# Patient Record
Sex: Female | Born: 1945 | Race: Black or African American | Hispanic: No | Smoking: Former smoker
Health system: Southern US, Community
[De-identification: ages and names within clinical notes are randomized; demographics above are authoritative.]

## PROBLEM LIST (undated history)

## (undated) DIAGNOSIS — R011 Cardiac murmur, unspecified: Secondary | ICD-10-CM

## (undated) DIAGNOSIS — E119 Type 2 diabetes mellitus without complications: Secondary | ICD-10-CM

## (undated) DIAGNOSIS — E7800 Pure hypercholesterolemia, unspecified: Secondary | ICD-10-CM

## (undated) DIAGNOSIS — J45909 Unspecified asthma, uncomplicated: Secondary | ICD-10-CM

## (undated) DIAGNOSIS — I10 Essential (primary) hypertension: Secondary | ICD-10-CM

## (undated) HISTORY — DX: Type 2 diabetes mellitus without complications: E11.9

## (undated) HISTORY — DX: Unspecified asthma, uncomplicated: J45.909

## (undated) HISTORY — DX: Cardiac murmur, unspecified: R01.1

## (undated) HISTORY — PX: TUBAL LIGATION: SHX77

## (undated) HISTORY — PX: TONSILLECTOMY: SUR1361

---

## 1998-05-01 ENCOUNTER — Other Ambulatory Visit: Admission: RE | Admit: 1998-05-01 | Discharge: 1998-05-01 | Payer: Self-pay | Admitting: Internal Medicine

## 1999-10-19 ENCOUNTER — Other Ambulatory Visit: Admission: RE | Admit: 1999-10-19 | Discharge: 1999-10-19 | Payer: Self-pay | Admitting: Internal Medicine

## 2000-10-17 ENCOUNTER — Other Ambulatory Visit: Admission: RE | Admit: 2000-10-17 | Discharge: 2000-10-17 | Payer: Self-pay | Admitting: Internal Medicine

## 2001-10-22 ENCOUNTER — Other Ambulatory Visit: Admission: RE | Admit: 2001-10-22 | Discharge: 2001-10-22 | Payer: Self-pay | Admitting: Internal Medicine

## 2004-03-17 ENCOUNTER — Ambulatory Visit (HOSPITAL_COMMUNITY): Admission: RE | Admit: 2004-03-17 | Discharge: 2004-03-17 | Payer: Self-pay | Admitting: Gastroenterology

## 2004-03-17 ENCOUNTER — Encounter (INDEPENDENT_AMBULATORY_CARE_PROVIDER_SITE_OTHER): Payer: Self-pay | Admitting: *Deleted

## 2005-11-04 ENCOUNTER — Encounter: Admission: RE | Admit: 2005-11-04 | Discharge: 2005-11-04 | Payer: Self-pay | Admitting: Gastroenterology

## 2007-06-15 ENCOUNTER — Emergency Department (HOSPITAL_COMMUNITY): Admission: EM | Admit: 2007-06-15 | Discharge: 2007-06-15 | Payer: Self-pay | Admitting: Family Medicine

## 2007-11-15 ENCOUNTER — Emergency Department (HOSPITAL_COMMUNITY): Admission: EM | Admit: 2007-11-15 | Discharge: 2007-11-15 | Payer: Self-pay | Admitting: Emergency Medicine

## 2011-03-25 NOTE — Op Note (Signed)
Anita Carpenter, Anita Carpenter                            ACCOUNT NO.:  [id number]   MEDICAL RECORD NO.:  [id number]                   PATIENT TYPE:  AMB   LOCATION:  ENDO                                 FACILITY:  [facility]   PHYSICIAN:  Anselmo Rod, M.D.               DATE OF BIRTH:  Mar 16, 1946   DATE OF PROCEDURE:  03/17/2004  DATE OF DISCHARGE:                                 OPERATIVE REPORT   PROCEDURE PERFORMED:  Colonoscopy with biopsies.   ENDOSCOPIST:  Charna Elizabeth, M.D.   INSTRUMENT USED:  Olympus video colonoscope.   INDICATIONS FOR PROCEDURE:  The patient is a 65 year old African-American  female undergoing a screening colonoscopy.  The patient has a history of  hematochezia and family history of colonic polyps in her mother.  No known  family history of colon cancer.   PREPROCEDURE PREPARATION:  Informed consent was procured from the patient.  The patient was fasted for eight hours prior to the procedure and prepped  with a bottle of magnesium citrate and a gallon of GoLYTELY the night prior  to the procedure.   PREPROCEDURE PHYSICAL:  The patient had stable vital signs.  Neck supple.  Chest clear to auscultation.  S1 and S2 regular.  Abdomen soft with normal  bowel sounds.   DESCRIPTION OF PROCEDURE:  The patient was placed in left lateral decubitus  position and sedated with 100 mg of Demerol and 10 mg of Versed  intravenously.  Once the patient was adequately sedated and maintained on  low flow oxygen and continuous cardiac monitoring, the Olympus video  colonoscope was advanced from the rectum to the cecum.  The appendicular  orifice and ileocecal valve were clearly visualized and photographed.  A  small sessile polyp was biopsied from the midtransverse colon.  There was  patchy erythema in the rectosigmoid of unclear significance.  This was  biopsied as well.  Retroflexion in the rectum revealed prominent internal  hemorrhoids.  No other abnormalities were appreciated.   The patient  tolerated the procedure well without immediate complications.   IMPRESSION:  1. Prominent nonbleeding internal hemorrhoids seen on retroflexion.  2. Patchy erythema in the rectosigmoid area biopsied for pathology.  3. Small sessile polyp biopsied from midtransverse colon.  4. Normal-appearing right colon and cecum.   RECOMMENDATIONS:  1. Await pathology results.  2. Avoid all nonsteroidals including aspirin for the next two weeks.  3. Continue high fiber diet with liberal fluid intake.  4. Anusol HC 2.5% suppositories one p.r. at bedtime.  5. Outpatient followup in the next two weeks for further recommendations.                                               Anselmo Rod, M.D.    JNM/MEDQ  D:  03/17/2004  T:  03/18/2004  Job:  [id number]   cc:   Merlene Laughter. Renae Gloss, M.D.  [address]  [address]  [place]  [place] [address]  Fax: [phone]

## 2014-01-23 ENCOUNTER — Observation Stay (HOSPITAL_COMMUNITY)
Admission: EM | Admit: 2014-01-23 | Discharge: 2014-01-24 | Disposition: A | Discharge status: Home / Self Care | Payer: Medicare Other | Attending: Internal Medicine | Admitting: Internal Medicine

## 2014-01-23 ENCOUNTER — Encounter (HOSPITAL_COMMUNITY): Payer: Self-pay | Admitting: Emergency Medicine

## 2014-01-23 ENCOUNTER — Emergency Department (HOSPITAL_COMMUNITY): Payer: Medicare Other

## 2014-01-23 DIAGNOSIS — I10 Essential (primary) hypertension: Secondary | ICD-10-CM | POA: Insufficient documentation

## 2014-01-23 DIAGNOSIS — K922 Gastrointestinal hemorrhage, unspecified: Secondary | ICD-10-CM | POA: Insufficient documentation

## 2014-01-23 DIAGNOSIS — R079 Chest pain, unspecified: Secondary | ICD-10-CM | POA: Diagnosis present

## 2014-01-23 DIAGNOSIS — Z79899 Other long term (current) drug therapy: Secondary | ICD-10-CM | POA: Insufficient documentation

## 2014-01-23 DIAGNOSIS — F17200 Nicotine dependence, unspecified, uncomplicated: Secondary | ICD-10-CM | POA: Insufficient documentation

## 2014-01-23 DIAGNOSIS — E7800 Pure hypercholesterolemia, unspecified: Secondary | ICD-10-CM | POA: Insufficient documentation

## 2014-01-23 DIAGNOSIS — R0789 Other chest pain: Principal | ICD-10-CM | POA: Insufficient documentation

## 2014-01-23 HISTORY — DX: Chest pain, unspecified: R07.9

## 2014-01-23 HISTORY — DX: Pure hypercholesterolemia, unspecified: E78.00

## 2014-01-23 HISTORY — DX: Essential (primary) hypertension: I10

## 2014-01-23 LAB — CBC WITH DIFFERENTIAL/PLATELET
BASOS ABS: 0 10*3/uL (ref 0.0–0.1)
Basophils Relative: 0 % (ref 0–1)
Eosinophils Absolute: 0.1 10*3/uL (ref 0.0–0.7)
Eosinophils Relative: 1 % (ref 0–5)
HCT: 37.1 % (ref 36.0–46.0)
Hemoglobin: 11.8 g/dL — ABNORMAL LOW (ref 12.0–15.0)
LYMPHS PCT: 40 % (ref 12–46)
Lymphs Abs: 2.2 10*3/uL (ref 0.7–4.0)
MCH: 23.5 pg — ABNORMAL LOW (ref 26.0–34.0)
MCHC: 31.8 g/dL (ref 30.0–36.0)
MCV: 73.9 fL — ABNORMAL LOW (ref 78.0–100.0)
Monocytes Absolute: 0.4 10*3/uL (ref 0.1–1.0)
Monocytes Relative: 8 % (ref 3–12)
NEUTROS ABS: 2.7 10*3/uL (ref 1.7–7.7)
Neutrophils Relative %: 50 % (ref 43–77)
PLATELETS: 326 10*3/uL (ref 150–400)
RBC: 5.02 MIL/uL (ref 3.87–5.11)
RDW: 16 % — AB (ref 11.5–15.5)
WBC: 5.4 10*3/uL (ref 4.0–10.5)

## 2014-01-23 LAB — BASIC METABOLIC PANEL
BUN: 13 mg/dL (ref 6–23)
CALCIUM: 9.6 mg/dL (ref 8.4–10.5)
CHLORIDE: 100 meq/L (ref 96–112)
CO2: 25 mEq/L (ref 19–32)
CREATININE: 0.69 mg/dL (ref 0.50–1.10)
GFR calc non Af Amer: 89 mL/min — ABNORMAL LOW (ref >90)
Glucose, Bld: 101 mg/dL — ABNORMAL HIGH (ref 70–99)
Potassium: 4.2 mEq/L (ref 3.7–5.3)
SODIUM: 139 meq/L (ref 137–147)

## 2014-01-23 LAB — POC OCCULT BLOOD, ED: FECAL OCCULT BLD: POSITIVE — AB

## 2014-01-23 LAB — I-STAT TROPONIN, ED: TROPONIN I, POC: 0 ng/mL (ref 0.00–0.08)

## 2014-01-23 LAB — TROPONIN I

## 2014-01-23 MED ORDER — METOPROLOL TARTRATE 25 MG PO TABS
25.0000 mg | ORAL_TABLET | Freq: Every day | ORAL | Status: DC
  Administered 2014-01-23 – 2014-01-24 (×2): 25 mg via ORAL
  Filled 2014-01-23 (×2): qty 1

## 2014-01-23 MED ORDER — MINOCYCLINE HCL 100 MG PO CAPS
100.0000 mg | ORAL_CAPSULE | Freq: Every day | ORAL | Status: DC
  Administered 2014-01-24: 100 mg via ORAL
  Filled 2014-01-23 (×2): qty 1

## 2014-01-23 MED ORDER — ALBUTEROL SULFATE HFA 108 (90 BASE) MCG/ACT IN AERS: 2.0000 | INHALATION_SPRAY | Freq: Four times a day (QID) | RESPIRATORY_TRACT | Status: DC | PRN

## 2014-01-23 MED ORDER — ASPIRIN 325 MG PO TABS
325.0000 mg | ORAL_TABLET | Freq: Once | ORAL | Status: AC
  Administered 2014-01-23: 325 mg via ORAL
  Filled 2014-01-23: qty 1

## 2014-01-23 MED ORDER — ATORVASTATIN CALCIUM 10 MG PO TABS
10.0000 mg | ORAL_TABLET | Freq: Every day | ORAL | Status: DC
  Filled 2014-01-23 (×2): qty 1

## 2014-01-23 MED ORDER — ENOXAPARIN SODIUM 40 MG/0.4ML ~~LOC~~ SOLN
40.0000 mg | Freq: Every day | SUBCUTANEOUS | Status: DC
  Filled 2014-01-23 (×2): qty 0.4

## 2014-01-23 MED ORDER — HYDRALAZINE HCL 25 MG PO TABS
25.0000 mg | ORAL_TABLET | Freq: Every day | ORAL | Status: DC
  Administered 2014-01-23 – 2014-01-24 (×2): 25 mg via ORAL
  Filled 2014-01-23 (×2): qty 1

## 2014-01-23 MED ORDER — IRBESARTAN 75 MG PO TABS
75.0000 mg | ORAL_TABLET | Freq: Every day | ORAL | Status: DC
Start: 2014-01-23 — End: 2014-01-24
  Filled 2014-01-23 (×2): qty 1

## 2014-01-23 MED ORDER — ALBUTEROL SULFATE (2.5 MG/3ML) 0.083% IN NEBU: 2.5000 mg | INHALATION_SOLUTION | Freq: Four times a day (QID) | RESPIRATORY_TRACT | Status: DC | PRN

## 2014-01-23 NOTE — ED Notes (Signed)
X-ray notified pt available for transport.  

## 2014-01-23 NOTE — ED Provider Notes (Signed)
CSN: [id number]     Arrival date & time 01/23/14  1247 History   First MD Initiated Contact with Patient 01/23/14 1300     Chief Complaint  Patient presents with  . Chest Pain  . GI Bleeding     (Consider location/radiation/quality/duration/timing/severity/associated sxs/prior Treatment) HPI 68 yo female presents with "chest tightness" and rectal bleeding. Patient admits to hx of hemorrhoids and fissures in the past. Patient states she was having a BM when she noticed blood while wiping herself. Patient states some blood dripped on the floor. As patient bent over to clean it up and upon standing she became lightheaded. Patient sat down on couch when she developed chest tightness with associate SOB and sweaty arms/hands. Patient admits to some jitteriness. Patient states she developed some neck stiffness on the way to the hospital. Patient also mentions she has been under a lot of stress lately from having a close friend recently dx with cancer. Patient states all of her symptom have resolved and is not currently having any chest pain or SOB.   PCP - Cala Bradford shelton  Advanced age > 82 yo: Yes HTN: Yes Hyperlipidemia: Yes Cigarette smoking: Yes (1/4 pack per day x 40 years)  Diabetes Mellitus: No Family hx of CAD or MI < 75 yo : No Female or Post menopausal: Yes Cocaine use: No Prior MI: No CABG: No Stress test: Yes, 2-3 years and reported to be normal  Angina: No  HR > 100 bpm: No O2 sat on RA < 95%: No Prior hx of venous thromboembolism:No Trauma or surgery in past 4 wks:No Hemoptysis:No Exogenous Estrogen use:No Unilateral Leg swelling: No Pre tests probability for PE < 15%:No  Past Medical History  Diagnosis Date  . Hypertension   . High cholesterol    Past Surgical History  Procedure Laterality Date  . Tonsillectomy     No family history on file. History  Substance Use Topics  . Smoking status: Current Some Day Smoker  . Smokeless tobacco: Not on file  . Alcohol  Use: Yes   OB History   Grav Para Term Preterm Abortions TAB SAB Ect Mult Living                 Review of Systems  All other systems reviewed and are negative.      Allergies  Review of patient's allergies indicates no known allergies.  Home Medications   Current Outpatient Rx  Name  Route  Sig  Dispense  Refill  . albuterol (PROVENTIL HFA;VENTOLIN HFA) 108 (90 BASE) MCG/ACT inhaler   Inhalation   Inhale 2 puffs into the lungs every 6 (six) hours as needed for wheezing or shortness of breath.         . Cholecalciferol (VITAMIN D3) 5000 UNITS CAPS   Oral   Take 5,000 Units by mouth daily.         . hydrALAZINE (APRESOLINE) 25 MG tablet   Oral   Take 25 mg by mouth daily.         . metoprolol tartrate (LOPRESSOR) 25 MG tablet   Oral   Take 25 mg by mouth daily.         . minocycline (MINOCIN,DYNACIN) 100 MG capsule   Oral   Take 100 mg by mouth daily.         . rosuvastatin (CRESTOR) 10 MG tablet   Oral   Take 10 mg by mouth daily.         . valsartan (DIOVAN)  320 MG tablet   Oral   Take 320 mg by mouth daily.          BP 130/76  Pulse 68  Temp(Src) 98.7 F (37.1 C) (Oral)  Resp 18  SpO2 99% Physical Exam  Nursing note and vitals reviewed. Constitutional: She is oriented to person, place, and time. She appears well-developed and well-nourished. No distress.  HENT:  Head: Normocephalic and atraumatic.  Right Ear: Tympanic membrane and ear canal normal.  Left Ear: Tympanic membrane and ear canal normal.  Nose: Nose normal. Right sinus exhibits no maxillary sinus tenderness and no frontal sinus tenderness. Left sinus exhibits no maxillary sinus tenderness and no frontal sinus tenderness.  Mouth/Throat: Uvula is midline, oropharynx is clear and moist and mucous membranes are normal. No oropharyngeal exudate, posterior oropharyngeal edema or posterior oropharyngeal erythema.  Eyes: Conjunctivae and EOM are normal. Pupils are equal, round, and  reactive to light. Right eye exhibits no discharge. Left eye exhibits no discharge. No scleral icterus.  Neck: Trachea normal, normal range of motion and phonation normal. Neck supple. No JVD present. Carotid bruit is not present. No rigidity. No tracheal deviation, no edema and no erythema present.  Cardiovascular: Normal rate, regular rhythm and normal heart sounds.  Exam reveals no gallop and no friction rub.   No murmur heard. Pulmonary/Chest: Effort normal and breath sounds normal. No stridor. No respiratory distress. She has no wheezes. She has no rhonchi. She has no rales.  Abdominal: Soft. Bowel sounds are normal. She exhibits no distension. There is no tenderness.  Genitourinary: Rectal exam shows external hemorrhoid. Rectal exam shows no internal hemorrhoid, no fissure, no mass, no tenderness and anal tone normal. Guaiac positive stool.  Multiple external hemorrhoids present on exam with noted Bright red blood around anus. Hemoccult positive but suspect falsely positive due to bleeding from hemorrhoids.   Musculoskeletal: Normal range of motion. She exhibits no edema.  Lymphadenopathy:    She has no cervical adenopathy.  Neurological: She is alert and oriented to person, place, and time. She has normal strength. No cranial nerve deficit or sensory deficit.  Skin: Skin is warm and dry. She is not diaphoretic.  Psychiatric: She has a normal mood and affect. Her behavior is normal.    ED Course  Procedures (including critical care time) Labs Review Labs Reviewed  BASIC METABOLIC PANEL - Abnormal; Notable for the following:    Glucose, Bld 101 (*)    GFR calc non Af Amer 89 (*)    All other components within normal limits  CBC WITH DIFFERENTIAL - Abnormal; Notable for the following:    Hemoglobin 11.8 (*)    MCV 73.9 (*)    MCH 23.5 (*)    RDW 16.0 (*)    All other components within normal limits  POC OCCULT BLOOD, ED - Abnormal; Notable for the following:    Fecal Occult Bld  POSITIVE (*)    All other components within normal limits  OCCULT BLOOD X 1 CARD TO LAB, STOOL  TROPONIN I  I-STAT TROPOININ, ED   Imaging Review Dg Chest 2 View  01/23/2014   CLINICAL DATA:  Chest pain with history of hypertension and tobacco use  EXAM: CHEST  2 VIEW  COMPARISON:  None.  FINDINGS: The lungs are borderline hyperinflated with hemidiaphragm flattening. There is no evidence of pneumonia nor abnormal parenchymal nodules. The cardiopericardial silhouette is normal in size. The pulmonary vascularity is not engorged. The mediastinum is normal in width. There is  no pleural effusion. There is mild tortuosity of the descending thoracic aorta. The observed portions of the bony thorax exhibit no acute abnormality. Degenerative disc space narrowing of the upper and mid thoracic spine is present.  IMPRESSION: There is very mild hyperinflation which may be voluntary but also may reflect underlying COPD or reactive airway disease. There is no evidence of pneumonia nor CHF or other acute cardiopulmonary disease.   Electronically Signed   By: David  [place]   On: 01/23/2014 14:27     EKG Interpretation   Date/Time:  Thursday January 23 2014 15:22:28 EDT Ventricular Rate:  68 PR Interval:  140 QRS Duration: 88 QT Interval:  389 QTC Calculation: 414 R Axis:   -18 Text Interpretation:  Sinus rhythm Probable left atrial enlargement  Borderline left axis deviation Low voltage, precordial leads ST changes  inferiorly Confirmed by Rubin Payor  MD, Harrold Donath [phone]) on 01/23/2014 4:09:04  PM      MDM   Final diagnoses:  Chest pain  GI bleed   Mild microcytic anemia with hgb at 11.8 BMP is WNL Hemoccult positive, suspect false positive secondary to active hemorrhoids with notable bleeding. Hx not consistent with Diverticulitis.  Patient does not meet PERC criteria due to her age but has minimal risk for PE using Well's PE criteria that does not factor in age.  Initial Troponin negative, will get  delta troponin.CXR shows mild hyperinflation, Possible COPD vs voluntary. No evidence of PNA or CHF. No widened mediastinum, doubt dissection.  EKG shows mild ST depression in inferior leads Heart score - 5.  Patient discussed with Dr. Benjiman Core. Will consult hospitalist. Plan to admit patient for ACS ruleout.   Meds given in ED:  Medications  aspirin tablet 325 mg (not administered)    New Prescriptions   No medications on file       Rudene Anda, [place] 01/23/14 2150

## 2014-01-23 NOTE — ED Notes (Addendum)
Got up at 1030 and went to have and she washed herself( bottom) and she had blood on the floor and on rag  then she got  dizzy after cleaning the floor and had cp  States does have hx of Hemorids she states and she did not see any blood in bowl after having bm this am

## 2014-01-23 NOTE — ED Notes (Signed)
Patient transported to X-ray 

## 2014-01-23 NOTE — H&P (Signed)
[facility] History and Physical  GELENA KLOSINSKI WUJ:[id number] DOB: 04-25-1946 DOA: 01/23/2014  Referring physician: ED physician PCP: No primary provider on file.   Chief Complaint: Chest tightness  HPI:  Patient is 68 year old very pleasant female history of hypertension, hyperlipidemia, GI bleed secondary to hemorrhoids fissures in the past, presenting to [facility] emergency department with main concern of sudden onset of chest tightness, pressure like, 5/10 in severity, lasting 30 minutes, nonradiating, resolving spontaneously, no specific aggravating or alleviating symptoms, no specific triggering symptoms, associated with shortness of breath, diaphoresis, lightheadedness. Patient denies similar events in the past. She reports having bowel movement earlier today and noticed bright red blood on the toilet paper after bowel movements. Some blood dropped on the floor and she was trying to clean it out at which home and she became more lightheaded and felt as if she was going to pass out. She denies fevers or chills, no specific abdominal or urinary concerns, no focal neurological symptoms such as headaches, visual changes, numbness or pain.  In emergency department, patient found to be hemodynamically stable, blood work unremarkable, [facility] asked to admit for chest pain workup. Telemetry bed requested  Assessment and Plan: Active Problems: Chest pain - Unclear etiology - Admit to telemetry, obtain 12-lead EKG, cycle cardiac enzymes - Provide oxygen and analgesia as needed for symptom control Blood in stool -  Possibly related to hemorrhoids - FOBT pending - Hemoglobin stable on admission, no indication for transfusion, repeat CBC in the morning Lightheadedness - Possibly associated with acute blood loss - Will check orthostatic vitals, TSH, cycle cardiac enzymes, obtain 12-lead EKG - Blood pressure stable on admission Hypertension - Continue home medical  regimen Hyperlipidemia - Continue statin  Radiological Exams on Admission: Dg Chest 2 View   01/23/2014   There is very mild hyperinflation which may be voluntary but also may reflect underlying COPD or reactive airway disease. There is no evidence of pneumonia nor CHF or other acute cardiopulmonary disease.    Code Status: Full Family Communication: Pt at bedside Disposition Plan: Admit for further evaluation     Review of Systems:  Constitutional: Negative for fever, chills. Negative for diaphoresis.  HENT: Negative for hearing loss, ear pain, nosebleeds, congestion, sore throat, neck pain, tinnitus and ear discharge.   Eyes: Negative for blurred vision, double vision, photophobia, pain, discharge and redness.  Respiratory: Negative for cough, hemoptysis, sputum production, shortness of breath, wheezing and stridor.   Cardiovascular: Per history of present illness Gastrointestinal: Negative for nausea, vomiting and abdominal pain. Negative for heartburn, constipation Genitourinary: Negative for dysuria, urgency, frequency, hematuria and flank pain.  Musculoskeletal: Negative for myalgias, back pain, joint pain and falls.  Skin: Negative for itching and rash.  Neurological: Negative for tingling, tremors, sensory change, speech change, focal weakness, loss of consciousness and headaches.  Endo/Heme/Allergies: Negative for environmental allergies and polydipsia. Does not bruise/bleed easily.  Psychiatric/Behavioral: Negative for suicidal ideas. The patient is not nervous/anxious.      Past Medical History  Diagnosis Date  . Hypertension   . High cholesterol     Past Surgical History  Procedure Laterality Date  . Tonsillectomy      Social History:  reports that she has been smoking.  She does not have any smokeless tobacco history on file. She reports that she drinks alcohol. Her drug history is not on file.  No Known Allergies   No known family medical history Prior to  Admission medications   Medication Sig Start  Date End Date Taking? Authorizing Provider  albuterol (PROVENTIL HFA;VENTOLIN HFA) 108 (90 BASE) MCG/ACT inhaler Inhale 2 puffs into the lungs every 6 (six) hours as needed for wheezing or shortness of breath.   Yes Historical Provider, MD  Cholecalciferol (VITAMIN D3) 5000 UNITS CAPS Take 5,000 Units by mouth daily.   Yes Historical Provider, MD  hydrALAZINE (APRESOLINE) 25 MG tablet Take 25 mg by mouth daily.   Yes Historical Provider, MD  metoprolol tartrate (LOPRESSOR) 25 MG tablet Take 25 mg by mouth daily.   Yes Historical Provider, MD  minocycline (MINOCIN,DYNACIN) 100 MG capsule Take 100 mg by mouth daily.   Yes Historical Provider, MD  rosuvastatin (CRESTOR) 10 MG tablet Take 10 mg by mouth daily.   Yes Historical Provider, MD  valsartan (DIOVAN) 320 MG tablet Take 320 mg by mouth daily.   Yes Historical Provider, MD    Physical Exam: Filed Vitals:   01/23/14 1515 01/23/14 1530 01/23/14 1545 01/23/14 1630  BP: 140/84 134/87 130/76 127/76  Pulse: 81 72 68 70  Temp:      TempSrc:      Resp: 11 15 18 25   SpO2: 100% 100% 99% 100%    Physical Exam  Constitutional: Appears well-developed and well-nourished. No distress.  HENT: Normocephalic. External right and left ear normal. Oropharynx is clear and moist.  Eyes: Conjunctivae and EOM are normal. PERRLA, no scleral icterus.  Neck: Normal ROM. Neck supple. No JVD. No tracheal deviation. No thyromegaly.  CVS: RRR, S1/S2 +, no murmurs, no gallops, no carotid bruit.  Pulmonary: Effort and breath sounds normal, no stridor, rhonchi, wheezes, rales.  Abdominal: Soft. BS +,  no distension, tenderness, rebound or guarding.  Musculoskeletal: Normal range of motion. No edema and no tenderness.  Lymphadenopathy: No lymphadenopathy noted, cervical, inguinal. Neuro: Alert. Normal reflexes, muscle tone coordination. No cranial nerve deficit. Skin: Skin is warm and dry. No rash noted. Not diaphoretic.  No erythema. No pallor.  Psychiatric: Normal mood and affect. Behavior, judgment, thought content normal.   Labs on Admission:  Basic Metabolic Panel:  Recent Labs Lab 01/23/14 1330  NA 139  K 4.2  CL 100  CO2 25  GLUCOSE 101*  BUN 13  CREATININE 0.69  CALCIUM 9.6   CBC:  Recent Labs Lab 01/23/14 1330  WBC 5.4  NEUTROABS 2.7  HGB 11.8*  HCT 37.1  MCV 73.9*  PLT 326    EKG: Normal sinus rhythm, no ST/T wave changes  Debbora PrestoMAGICK-Yamile Roedl, MD  [facility] Pager [phone]  If 7PM-7AM, please contact night-coverage [email or web address] Password [email or web address] 01/23/2014, 4:46 PM

## 2014-01-23 NOTE — ED Notes (Signed)
PA Grey collected sample.

## 2014-01-24 ENCOUNTER — Inpatient Hospital Stay (HOSPITAL_COMMUNITY): Payer: Medicare Other

## 2014-01-24 DIAGNOSIS — K922 Gastrointestinal hemorrhage, unspecified: Secondary | ICD-10-CM

## 2014-01-24 DIAGNOSIS — R079 Chest pain, unspecified: Secondary | ICD-10-CM

## 2014-01-24 LAB — TSH: TSH: 2.803 u[IU]/mL (ref 0.350–4.500)

## 2014-01-24 LAB — CBC
HEMATOCRIT: 35 % — AB (ref 36.0–46.0)
HEMOGLOBIN: 11.1 g/dL — AB (ref 12.0–15.0)
MCH: 23.5 pg — AB (ref 26.0–34.0)
MCHC: 31.7 g/dL (ref 30.0–36.0)
MCV: 74.2 fL — AB (ref 78.0–100.0)
PLATELETS: 301 10*3/uL (ref 150–400)
RBC: 4.72 MIL/uL (ref 3.87–5.11)
RDW: 16 % — ABNORMAL HIGH (ref 11.5–15.5)
WBC: 6.1 10*3/uL (ref 4.0–10.5)

## 2014-01-24 LAB — BASIC METABOLIC PANEL
BUN: 21 mg/dL (ref 6–23)
CHLORIDE: 105 meq/L (ref 96–112)
CO2: 26 mEq/L (ref 19–32)
Calcium: 9.3 mg/dL (ref 8.4–10.5)
Creatinine, Ser: 0.85 mg/dL (ref 0.50–1.10)
GFR, EST AFRICAN AMERICAN: 81 mL/min — AB (ref >90)
GFR, EST NON AFRICAN AMERICAN: 70 mL/min — AB (ref >90)
GLUCOSE: 108 mg/dL — AB (ref 70–99)
Potassium: 4.2 mEq/L (ref 3.7–5.3)
Sodium: 142 mEq/L (ref 137–147)

## 2014-01-24 LAB — TROPONIN I

## 2014-01-24 MED ORDER — FERROUS SULFATE 325 (65 FE) MG PO TABS
325.0000 mg | ORAL_TABLET | Freq: Every day | ORAL | Status: DC
  Filled 2014-01-24: qty 1

## 2014-01-24 MED ORDER — TECHNETIUM TC 99M SESTAMIBI - CARDIOLITE
30.0000 | Freq: Once | INTRAVENOUS | Status: AC | PRN
  Administered 2014-01-24: 30 via INTRAVENOUS

## 2014-01-24 MED ORDER — TECHNETIUM TC 99M SESTAMIBI GENERIC - CARDIOLITE
10.0000 | Freq: Once | INTRAVENOUS | Status: AC | PRN
  Administered 2014-01-24: 10 via INTRAVENOUS

## 2014-01-24 MED ORDER — HYDROCORTISONE 2.5 % RE CREA: TOPICAL_CREAM | Freq: Two times a day (BID) | RECTAL | Status: DC | PRN

## 2014-01-24 MED ORDER — FERROUS SULFATE 325 (65 FE) MG PO TABS: 325.0000 mg | ORAL_TABLET | Freq: Every day | ORAL | Status: AC

## 2014-01-24 MED ORDER — POLYETHYLENE GLYCOL 3350 17 G PO PACK
17.0000 g | PACK | Freq: Every day | ORAL | Status: DC
  Filled 2014-01-24: qty 1

## 2014-01-24 MED ORDER — REGADENOSON 0.4 MG/5ML IV SOLN
INTRAVENOUS | Status: AC
  Filled 2014-01-24: qty 5

## 2014-01-24 MED ORDER — REGADENOSON 0.4 MG/5ML IV SOLN
0.4000 mg | Freq: Once | INTRAVENOUS | Status: DC
  Filled 2014-01-24: qty 5

## 2014-01-24 MED ORDER — HYDROCORTISONE ACE-PRAMOXINE 1-1 % RE FOAM
1.0000 | Freq: Two times a day (BID) | RECTAL | Status: DC
  Filled 2014-01-24: qty 10

## 2014-01-24 NOTE — Discharge Summary (Signed)
Physician Discharge Summary  Anita Carpenter ZOX:[id number] DOB: Oct 26, 1946 DOA: 01/23/2014  PCP: No primary provider on file.  Admit date: 01/23/2014 Discharge date: 01/24/2014  Time spent: 35 minutes  Recommendations for Outpatient Follow-up:  1. Follow up with primary gastroenterologist for further evaluation of hemorrhoids.  2. Need CBC.    Discharge Diagnoses:    Chest pain   GI bleed, hemorrhoids.    Anemia   Discharge Condition: stable.   Diet recommendation: heart healthy  Filed Weights   01/23/14 1912 01/24/14 0009  Weight: 71.26 kg (157 lb 1.6 oz) 71.26 kg (157 lb 1.6 oz)    History of present illness:  Patient is 68 year old very pleasant female history of hypertension, hyperlipidemia, GI bleed secondary to hemorrhoids fissures in the past, presenting to [facility] emergency department with main concern of sudden onset of chest tightness, pressure like, 5/10 in severity, lasting 30 minutes, nonradiating, resolving spontaneously, no specific aggravating or alleviating symptoms, no specific triggering symptoms, associated with shortness of breath, diaphoresis, lightheadedness. Patient denies similar events in the past. She reports having bowel movement earlier today and noticed bright red blood on the toilet paper after bowel movements. Some blood dropped on the floor and she was trying to clean it out at which home and she became more lightheaded and felt as if she was going to pass out. She denies fevers or chills, no specific abdominal or urinary concerns, no focal neurological symptoms such as headaches, visual changes, numbness or pain.   Hospital Course:  Chest pain  - Resolved. Troponin times 3 negative. Myoview low risk for ischemia.   Gi Bled; secondary to hemorrhoids.  - Possibly related to hemorrhoids  - FOBT positive.  -Hb stable at 11.  -Miralax, stool softener.  -hydrocortisone PRN.   Lightheadedness  - Possibly associated with acute blood loss   -resolved.   Hypertension  - Continue home medical regimen   Hyperlipidemia  - Continue statin   Radiological Exams on Admission:  Dg Chest 2 View 01/23/2014 There is very mild hyperinflation which may be voluntary but also may reflect underlying COPD or reactive airway disease. There is no evidence of pneumonia nor CHF or other acute cardiopulmonary disease.    Procedures: Myoview; 1. Low risk study.  2. Small, mild fixed apical perfusion defect. Given normal wall  motion, this may represent breast shadowing. No ischemia.  3. Normal LV systolic function and regional wall motion.    Consultations:  Cardiology  Discharge Exam: Filed Vitals:   01/24/14 1600  BP: 100/48  Pulse: 63  Temp: 98.3 F (36.8 C)  Resp: 18    General: No distress.  Cardiovascular: S 1, S 2 RRR Respiratory: CTA  Discharge Instructions      Discharge Orders   Future Orders Complete By Expires   Diet - low sodium heart healthy  As directed    Increase activity slowly  As directed        Medication List         albuterol 108 (90 BASE) MCG/ACT inhaler  Commonly known as:  PROVENTIL HFA;VENTOLIN HFA  Inhale 2 puffs into the lungs every 6 (six) hours as needed for wheezing or shortness of breath.     ferrous sulfate 325 (65 FE) MG tablet  Take 1 tablet (325 mg total) by mouth daily with breakfast.  Start taking on:  01/25/2014     hydrALAZINE 25 MG tablet  Commonly known as:  APRESOLINE  Take 25 mg by mouth daily.  hydrocortisone 2.5 % rectal cream  Commonly known as:  ANUSOL-HC  Place rectally 2 (two) times daily as needed for hemorrhoids or itching.     metoprolol tartrate 25 MG tablet  Commonly known as:  LOPRESSOR  Take 25 mg by mouth daily.     minocycline 100 MG capsule  Commonly known as:  MINOCIN,DYNACIN  Take 100 mg by mouth daily.     rosuvastatin 10 MG tablet  Commonly known as:  CRESTOR  Take 10 mg by mouth daily.     valsartan 320 MG tablet  Commonly known  as:  DIOVAN  Take 320 mg by mouth daily.     Vitamin D3 5000 UNITS Caps  Take 5,000 Units by mouth daily.       No Known Allergies    The results of significant diagnostics from this hospitalization (including imaging, microbiology, ancillary and laboratory) are listed below for reference.    Significant Diagnostic Studies: Dg Chest 2 View  01/23/2014   CLINICAL DATA:  Chest pain with history of hypertension and tobacco use  EXAM: CHEST  2 VIEW  COMPARISON:  None.  FINDINGS: The lungs are borderline hyperinflated with hemidiaphragm flattening. There is no evidence of pneumonia nor abnormal parenchymal nodules. The cardiopericardial silhouette is normal in size. The pulmonary vascularity is not engorged. The mediastinum is normal in width. There is no pleural effusion. There is mild tortuosity of the descending thoracic aorta. The observed portions of the bony thorax exhibit no acute abnormality. Degenerative disc space narrowing of the upper and mid thoracic spine is present.  IMPRESSION: There is very mild hyperinflation which may be voluntary but also may reflect underlying COPD or reactive airway disease. There is no evidence of pneumonia nor CHF or other acute cardiopulmonary disease.   Electronically Signed   By: David  [place]   On: 01/23/2014 14:27    Microbiology: No results found for this or any previous visit (from the past 240 hour(s)).   Labs: Basic Metabolic Panel:  Recent Labs Lab 01/23/14 1330 01/24/14 0613  NA 139 142  K 4.2 4.2  CL 100 105  CO2 25 26  GLUCOSE 101* 108*  BUN 13 21  CREATININE 0.69 0.85  CALCIUM 9.6 9.3   Liver Function Tests: No results found for this basename: AST, ALT, ALKPHOS, BILITOT, PROT, ALBUMIN,  in the last 168 hours No results found for this basename: LIPASE, AMYLASE,  in the last 168 hours No results found for this basename: AMMONIA,  in the last 168 hours CBC:  Recent Labs Lab 01/23/14 1330 01/24/14 0613  WBC 5.4 6.1   NEUTROABS 2.7  --   HGB 11.8* 11.1*  HCT 37.1 35.0*  MCV 73.9* 74.2*  PLT 326 301   Cardiac Enzymes:  Recent Labs Lab 01/23/14 1625 01/23/14 2037 01/24/14 0030 01/24/14 0830  TROPONINI <0.30 <0.30 <0.30 <0.30   BNP: BNP (last 3 results) No results found for this basename: PROBNP,  in the last 8760 hours CBG: No results found for this basename: GLUCAP,  in the last 168 hours     Signed:  Hartley Barefootegalado, Dan Dissinger A  [facility] 01/24/2014, 5:29 PM

## 2014-01-24 NOTE — Progress Notes (Signed)
Pt discharged to home per MD order. Pt received and reviewed all discharge instructions and medication information including follow-up appointments and prescription information. Pt verbalized understanding. Pt alert and oriented at discharge with no complaints of pain. Pt IV and telemetry box removed prior to discharge. Pt ambulated to private vehicle per pt request. Anita Carpenter C  

## 2014-01-27 NOTE — ED Provider Notes (Signed)
Medical screening examination/treatment/procedure(s) were conducted as a shared visit with non-physician practitioner(s) and myself.  I personally evaluated the patient during the encounter.   EKG Interpretation   Date/Time:  Thursday January 23 2014 15:22:28 EDT Ventricular Rate:  68 PR Interval:  140 QRS Duration: 88 QT Interval:  389 QTC Calculation: 414 R Axis:   -18 Text Interpretation:  Sinus rhythm Probable left atrial enlargement  Borderline left axis deviation Low voltage, precordial leads ST changes  inferiorly Confirmed by Rubin PayorPICKERING  MD, Abigale Dorow [phone] on 01/23/2014 4:09:04  PM       Juliet RudeNathan R. Rubin PayorPickering, MD 01/27/14 [phone]

## 2014-06-10 ENCOUNTER — Encounter: Payer: Medicare Other | Attending: Internal Medicine

## 2014-06-10 VITALS — Ht <= 58 in | Wt 158.2 lb

## 2014-06-10 DIAGNOSIS — Z713 Dietary counseling and surveillance: Secondary | ICD-10-CM | POA: Insufficient documentation

## 2014-06-10 DIAGNOSIS — E119 Type 2 diabetes mellitus without complications: Secondary | ICD-10-CM

## 2014-06-10 NOTE — Progress Notes (Signed)

## 2014-06-17 DIAGNOSIS — E119 Type 2 diabetes mellitus without complications: Secondary | ICD-10-CM

## 2014-06-17 NOTE — Progress Notes (Signed)

## 2014-06-24 DIAGNOSIS — E119 Type 2 diabetes mellitus without complications: Secondary | ICD-10-CM | POA: Diagnosis not present

## 2014-06-24 NOTE — Progress Notes (Signed)
Patient was seen on 06/24/2014 for the third of a series of three diabetes self-management courses at the [facility]. The following learning objectives were met by the patient during this class:    State the amount of activity recommended for healthy living   Describe activities suitable for individual needs   Identify ways to regularly incorporate activity into daily life   Identify barriers to activity and ways to over come these barriers  Identify diabetes medications being personally used and their primary action for lowering glucose and possible side effects   Describe role of stress on blood glucose and develop strategies to address psychosocial issues   Identify diabetes complications and ways to prevent them  Explain how to manage diabetes during illness   Evaluate success in meeting personal goal   Establish 2-3 goals that they will plan to diligently work on until they return for the  60-monthfollow-up visit  Goals:  Follow Diabetes Meal Plan as instructed  Aim for 15-30 mins of physical activity daily as tolerated  Bring food record and glucose log to your follow up visit  Your patient has established the following 4 month goals in their individualized success plan: I will count my carb choices at most meals and snacks I will increase my activity level at least 3 days a week  Your patient has identified these potential barriers to change:  None stated  Your patient has identified their diabetes self-care support plan as  [facility]  Plan:  Attend Core 4 in 4 months

## 2014-10-13 ENCOUNTER — Encounter: Payer: Medicare Other | Attending: Internal Medicine

## 2014-10-13 DIAGNOSIS — Z713 Dietary counseling and surveillance: Secondary | ICD-10-CM | POA: Diagnosis not present

## 2014-10-13 DIAGNOSIS — E119 Type 2 diabetes mellitus without complications: Secondary | ICD-10-CM

## 2014-10-14 NOTE — Progress Notes (Signed)
Appt start time: 1730 end time:  1830.  Patient was seen on 10/14/2014 for a review of the series of three diabetes self-management courses at the [facility]. The following learning objectives were met by the patient during this class:  . Reviewed blood glucose monitoring and interpretation including the recommended target ranges and Hgb A1c.  . Reviewed on carb counting, importance of regularly scheduled meals/snacks, and meal planning.  . Reviewed the effects of physical activity on glucose levels and long-term glucose control.  Recommended goal of 150 minutes of physical activity/week. . Reviewed patient medications and discussed role of medication on blood glucose and possible side effects. . Discussed strategies to manage stress, psychosocial issues, and other obstacles to diabetes management. . Encouraged moderate weight reduction to improve glucose levels.   . Reviewed short-term complications: hyper- and hypo-glycemia.  Discussed causes, symptoms, and treatment options. . Reviewed prevention, detection, and treatment of long-term complications.  Discussed the role of prolonged elevated glucose levels on body systems.  Goals:  Follow Diabetes Meal Plan as instructed  Eat 3 meals and 2 snacks, every 3-5 hrs  Limit carbohydrate intake to 45 grams carbohydrate/meal Limit carbohydrate intake to 15 grams carbohydrate/snack Add lean protein foods to meals/snacks  Monitor glucose levels as instructed by your doctor  Aim for goal of 15-30 mins of physical activity daily as tolerated  Bring food record and glucose log to your next nutrition visit

## 2015-10-15 ENCOUNTER — Other Ambulatory Visit: Payer: Self-pay | Admitting: Radiology

## 2018-07-17 DIAGNOSIS — I129 Hypertensive chronic kidney disease with stage 1 through stage 4 chronic kidney disease, or unspecified chronic kidney disease: Secondary | ICD-10-CM | POA: Insufficient documentation

## 2018-07-17 DIAGNOSIS — N182 Chronic kidney disease, stage 2 (mild): Secondary | ICD-10-CM | POA: Diagnosis not present

## 2018-07-17 DIAGNOSIS — Z79899 Other long term (current) drug therapy: Secondary | ICD-10-CM | POA: Diagnosis not present

## 2018-07-17 DIAGNOSIS — Z23 Encounter for immunization: Secondary | ICD-10-CM | POA: Diagnosis not present

## 2018-07-17 DIAGNOSIS — Z720 Tobacco use: Secondary | ICD-10-CM

## 2018-07-17 LAB — BASIC METABOLIC PANEL
BUN: 13 (ref 4–21)
Creatinine: 0.8 (ref 0.5–1.1)
GLUCOSE: 96
POTASSIUM: 4.4 (ref 3.4–5.3)
Sodium: 143 (ref 137–147)

## 2018-08-05 ENCOUNTER — Encounter: Payer: Self-pay | Admitting: Internal Medicine

## 2018-08-05 DIAGNOSIS — N182 Chronic kidney disease, stage 2 (mild): Principal | ICD-10-CM

## 2018-08-05 DIAGNOSIS — I129 Hypertensive chronic kidney disease with stage 1 through stage 4 chronic kidney disease, or unspecified chronic kidney disease: Secondary | ICD-10-CM

## 2018-08-09 ENCOUNTER — Ambulatory Visit: Payer: Self-pay | Admitting: Internal Medicine

## 2018-08-17 LAB — HM MAMMOGRAPHY: HM Mammogram: NORMAL (ref 0–4)

## 2018-09-10 ENCOUNTER — Encounter: Payer: Self-pay | Admitting: Internal Medicine

## 2018-09-17 ENCOUNTER — Ambulatory Visit (INDEPENDENT_AMBULATORY_CARE_PROVIDER_SITE_OTHER): Payer: Medicare Other | Admitting: Internal Medicine

## 2018-09-17 ENCOUNTER — Encounter: Payer: Self-pay | Admitting: Internal Medicine

## 2018-09-17 VITALS — BP 120/76 | HR 78 | Temp 98.8°F | Ht <= 58 in | Wt 150.0 lb

## 2018-09-17 DIAGNOSIS — E1165 Type 2 diabetes mellitus with hyperglycemia: Secondary | ICD-10-CM | POA: Diagnosis not present

## 2018-09-17 DIAGNOSIS — N1831 Chronic kidney disease, stage 3a: Secondary | ICD-10-CM | POA: Insufficient documentation

## 2018-09-17 DIAGNOSIS — N182 Chronic kidney disease, stage 2 (mild): Secondary | ICD-10-CM | POA: Diagnosis not present

## 2018-09-17 DIAGNOSIS — I129 Hypertensive chronic kidney disease with stage 1 through stage 4 chronic kidney disease, or unspecified chronic kidney disease: Secondary | ICD-10-CM | POA: Diagnosis not present

## 2018-09-17 DIAGNOSIS — E1122 Type 2 diabetes mellitus with diabetic chronic kidney disease: Secondary | ICD-10-CM

## 2018-09-17 DIAGNOSIS — F17210 Nicotine dependence, cigarettes, uncomplicated: Secondary | ICD-10-CM

## 2018-09-17 DIAGNOSIS — J309 Allergic rhinitis, unspecified: Secondary | ICD-10-CM

## 2018-09-17 DIAGNOSIS — IMO0002 Reserved for concepts with insufficient information to code with codable children: Secondary | ICD-10-CM | POA: Insufficient documentation

## 2018-09-17 DIAGNOSIS — R0982 Postnasal drip: Secondary | ICD-10-CM

## 2018-09-17 HISTORY — DX: Chronic kidney disease, stage 2 (mild): N18.2

## 2018-09-17 MED ORDER — BUPROPION HCL ER (XL) 150 MG PO TB24: 150.0000 mg | ORAL_TABLET | ORAL | 2 refills | Status: DC

## 2018-09-17 MED ORDER — CARBINOXAMINE MALEATE 6 MG PO TABS: 6.0000 mg | ORAL_TABLET | Freq: Every evening | ORAL | Status: DC

## 2018-09-17 NOTE — Progress Notes (Signed)
Subjective:     Patient ID: Anita Carpenter , female    DOB: 11-07-1946 , 72 y.o.   MRN: [id number]   Chief Complaint  Patient presents with  . Diabetes  . Hypertension    HPI  Diabetes  She presents for her follow-up diabetic visit. She has type 2 diabetes mellitus. Her disease course has been stable. There are no hypoglycemic associated symptoms. Pertinent negatives for hypoglycemia include no headaches. There are no diabetic associated symptoms. Pertinent negatives for diabetes include no blurred vision and no chest pain. There are no hypoglycemic complications. Diabetic complications include nephropathy. Risk factors for coronary artery disease include diabetes mellitus, dyslipidemia, hypertension, sedentary lifestyle and tobacco exposure.  Hypertension  This is a chronic problem. The current episode started more than 1 year ago. The problem is unchanged. The problem is controlled. Pertinent negatives include no blurred vision, chest pain, headaches, palpitations or shortness of breath.     Past Medical History:  Diagnosis Date  . Asthma   . Diabetes mellitus without complication ([place])   . High cholesterol   . Hypertension      Family History  Problem Relation Age of Onset  . Asthma Other   . Diabetes Other   . Hyperlipidemia Other      Current Outpatient Medications:  .  albuterol (PROVENTIL HFA;VENTOLIN HFA) 108 (90 BASE) MCG/ACT inhaler, Inhale 2 puffs into the lungs every 6 (six) hours as needed for wheezing or shortness of breath., Disp: , Rfl:  .  amLODipine (NORVASC) 5 MG tablet, Take 5 mg by mouth daily., Disp: , Rfl:  .  Cholecalciferol (VITAMIN D3) 5000 UNITS CAPS, Take 5,000 Units by mouth daily., Disp: , Rfl:  .  ferrous sulfate 325 (65 FE) MG tablet, Take 1 tablet (325 mg total) by mouth daily with breakfast., Disp: 30 tablet, Rfl: 3 .  glucose blood (ONETOUCH VERIO) test strip, 1 each by Other route daily. Use as instructed, Disp: , Rfl:  .  hydrALAZINE  (APRESOLINE) 25 MG tablet, Take 25 mg by mouth daily., Disp: , Rfl:  .  hydrocortisone (ANUSOL-HC) 2.5 % rectal cream, Place rectally 2 (two) times daily as needed for hemorrhoids or itching., Disp: 10 g, Rfl: 0 .  metoprolol tartrate (LOPRESSOR) 25 MG tablet, Take 25 mg by mouth daily., Disp: , Rfl:  .  olmesartan-hydrochlorothiazide (BENICAR HCT) 40-25 MG tablet, Take 1 tablet by mouth daily., Disp: , Rfl:  .  rosuvastatin (CRESTOR) 10 MG tablet, Take 10 mg by mouth daily., Disp: , Rfl:  .  buPROPion (WELLBUTRIN XL) 150 MG 24 hr tablet, Take 1 tablet (150 mg total) by mouth every morning., Disp: 30 tablet, Rfl: 2   Allergies  Allergen Reactions  . Doxycycline Rash     Review of Systems  Constitutional: Negative.   HENT: Positive for postnasal drip and rhinorrhea.   Eyes: Negative for blurred vision.  Respiratory: Positive for cough (she has cough, nonproductive cough). Negative for shortness of breath.   Cardiovascular: Negative.  Negative for chest pain and palpitations.  Gastrointestinal: Negative.   Neurological: Negative.  Negative for headaches.  Psychiatric/Behavioral: Negative.      Today's Vitals   09/17/18 1152  BP: 120/76  Pulse: 78  Temp: 98.8 F (37.1 C)  TempSrc: Oral  SpO2: 98%  Weight: 150 lb (68 kg)  Height: '4\' 10"'$  (1.473 m)   Body mass index is 31.35 kg/m.   Objective:  Physical Exam  Constitutional: She is oriented to person, place, and  time. She appears well-developed and well-nourished.  HENT:  Head: Normocephalic and atraumatic.  Eyes: EOM are normal.  Neck: Normal range of motion.  Cardiovascular: Normal rate, regular rhythm and normal heart sounds.  Pulmonary/Chest: Effort normal and breath sounds normal.  Neurological: She is alert and oriented to person, place, and time.  Psychiatric: She has a normal mood and affect.  Nursing note and vitals reviewed.       Assessment And Plan:     1. Type 2 diabetes mellitus with stage 2 chronic  kidney disease, without long-term current use of insulin ([name])  I will check labs as listed below. She is encouraged to incorporate more exercise into her daily routine.   - CMP14+EGFR - Hemoglobin A1c  2. Chronic renal disease, stage II  Chronic. Importance of optimal BS/BP control was discussed with the patient.   3. Hypertensive nephropathy  Well controlled. She will continue with current meds. She is encouraged to avoid adding salt to her foods.   4. Allergic rhinitis with postnasal drip  She was given samples of Ryvent to take nightly for next 3-4 days. She will let me know if her symptoms persist.  5. Cigarette nicotine dependence without complication  Ready to quit: Yes Counseling given: Yes  The patient was counseled on the dangers of tobacco use, and was advised to quit. Reviewed strategies to maximize success, including use of medications to help her quit. She is agreeable to starting Wellbutrin Xl 176m once daily. She is advised to take no later than 11am daily. She is aware of possible side effects as well. She agrees to rto in four weeks for re-evaluation. She agrees to pick a quit date within next 4 weeks. Our discussion lasted more than 5 minutes.   RMaximino Greenland MD

## 2018-09-17 NOTE — Patient Instructions (Signed)

## 2018-09-18 LAB — CMP14+EGFR
ALT: 18 IU/L (ref 0–32)
AST: 21 IU/L (ref 0–40)
Albumin/Globulin Ratio: 1.8 (ref 1.2–2.2)
Albumin: 4.4 g/dL (ref 3.5–4.8)
Alkaline Phosphatase: 82 IU/L (ref 39–117)
BILIRUBIN TOTAL: 0.5 mg/dL (ref 0.0–1.2)
BUN/Creatinine Ratio: 15 (ref 12–28)
BUN: 14 mg/dL (ref 8–27)
CHLORIDE: 106 mmol/L (ref 96–106)
CO2: 23 mmol/L (ref 20–29)
Calcium: 10.1 mg/dL (ref 8.7–10.3)
Creatinine, Ser: 0.94 mg/dL (ref 0.57–1.00)
GFR, EST AFRICAN AMERICAN: 71 mL/min/{1.73_m2} (ref >59)
GFR, EST NON AFRICAN AMERICAN: 61 mL/min/{1.73_m2} (ref >59)
Globulin, Total: 2.5 g/dL (ref 1.5–4.5)
Glucose: 97 mg/dL (ref 65–99)
POTASSIUM: 4.2 mmol/L (ref 3.5–5.2)
Sodium: 143 mmol/L (ref 134–144)
TOTAL PROTEIN: 6.9 g/dL (ref 6.0–8.5)

## 2018-09-18 LAB — HEMOGLOBIN A1C
Est. average glucose Bld gHb Est-mCnc: 128 mg/dL
Hgb A1c MFr Bld: 6.1 % — ABNORMAL HIGH (ref 4.8–5.6)

## 2018-09-18 NOTE — Progress Notes (Signed)
Your liver and kidney fxn are nl. Your hba1c is 6.1 -- pls cut out sugary beverages and processed foods.

## 2018-09-21 ENCOUNTER — Telehealth: Payer: Self-pay

## 2018-09-21 NOTE — Telephone Encounter (Signed)
Let the pt a message to call back for her lab results.

## 2018-11-08 ENCOUNTER — Encounter: Payer: Self-pay | Admitting: Internal Medicine

## 2018-11-08 ENCOUNTER — Ambulatory Visit (INDEPENDENT_AMBULATORY_CARE_PROVIDER_SITE_OTHER): Payer: Medicare Other | Admitting: Internal Medicine

## 2018-11-08 VITALS — BP 142/90 | HR 70 | Temp 98.4°F | Ht <= 58 in | Wt 148.4 lb

## 2018-11-08 DIAGNOSIS — N182 Chronic kidney disease, stage 2 (mild): Secondary | ICD-10-CM | POA: Diagnosis not present

## 2018-11-08 DIAGNOSIS — I129 Hypertensive chronic kidney disease with stage 1 through stage 4 chronic kidney disease, or unspecified chronic kidney disease: Secondary | ICD-10-CM | POA: Diagnosis not present

## 2018-11-08 DIAGNOSIS — F17210 Nicotine dependence, cigarettes, uncomplicated: Secondary | ICD-10-CM

## 2018-11-08 DIAGNOSIS — Z6831 Body mass index (BMI) 31.0-31.9, adult: Secondary | ICD-10-CM

## 2018-11-08 DIAGNOSIS — E6609 Other obesity due to excess calories: Secondary | ICD-10-CM | POA: Diagnosis not present

## 2018-11-08 NOTE — Patient Instructions (Signed)
Tobacco Use Disorder Tobacco use disorder (TUD) occurs when a person craves, seeks, and uses tobacco, regardless of the consequences. This disorder can cause problems with mental and physical health. It can affect your ability to have healthy relationships, and it can keep you from meeting your responsibilities at work, home, or school. Tobacco may be:  Smoked as a cigarette or cigar.  Inhaled using e-cigarettes.  Smoked in a pipe or hookah.  Chewed as smokeless tobacco.  Inhaled into the nostrils as snuff. Tobacco products contain a dangerous chemical called nicotine, which is very addictive. Nicotine triggers hormones that make the body feel stimulated and works on areas of the brain that make you feel good. These effects can make it hard for people to quit nicotine. Tobacco contains many other unsafe chemicals that can damage almost every organ in the body. Smoking tobacco also puts others in danger due to fire risk and possible health problems caused by breathing in secondhand smoke. What are the signs or symptoms? Symptoms of TUD may include:  Being unable to slow down or stop your tobacco use.  Spending an abnormal amount of time getting or using tobacco.  Craving tobacco. Cravings may last for up to 6 months after quitting.  Tobacco use that: ? Interferes with your work, school, or home life. ? Interferes with your personal and social relationships. ? Makes you give up activities that you once enjoyed or found important.  Using tobacco even though you know that it is: ? Dangerous or bad for your health or someone else's health. ? Causing problems in your life.  Needing more and more of the substance to get the same effect (developing tolerance).  Experiencing unpleasant symptoms if you do not use the substance (withdrawal). Withdrawal symptoms may include: ? Depressed, anxious, or irritable mood. ? Difficulty concentrating. ? Increased appetite. ? Restlessness or trouble  sleeping.  Using the substance to avoid withdrawal. How is this diagnosed? This condition may be diagnosed based on:  Your current and past tobacco use. Your health care provider may ask questions about how your tobacco use affects your life.  A physical exam. You may be diagnosed with TUD if you have at least two symptoms within a 12-month period. How is this treated? This condition is treated by stopping tobacco use. Many people are unable to quit on their own and need help. Treatment may include:  Nicotine replacement therapy (NRT). NRT provides nicotine without the other harmful chemicals in tobacco. NRT gradually lowers the dosage of nicotine in the body and reduces withdrawal symptoms. NRT is available as: ? Over-the-counter gums, lozenges, and skin patches. ? Prescription mouth inhalers and nasal sprays.  Medicine that acts on the brain to reduce cravings and withdrawal symptoms.  A type of talk therapy that examines your triggers for tobacco use, how to avoid them, and how to cope with cravings (behavioral therapy).  Hypnosis. This may help with withdrawal symptoms.  Joining a support group for others coping with TUD. The best treatment for TUD is usually a combination of medicine, talk therapy, and support groups. Recovery can be a long process. Many people start using tobacco again after stopping (relapse). If you relapse, it does not mean that treatment will not work. Follow these instructions at home:  Lifestyle  Do not use any products that contain nicotine or tobacco, such as cigarettes and e-cigarettes.  Avoid things that trigger tobacco use as much as you can. Triggers include people and situations that usually cause you   to use tobacco.  Avoid drinks that contain caffeine, including coffee. These may worsen some withdrawal symptoms.  Find ways to manage stress. Wanting to smoke may cause stress, and stress can make you want to smoke. Relaxation techniques such as  deep breathing, meditation, and yoga may help.  Attend support groups as needed. These groups are an important part of long-term recovery for many people. General instructions  Take over-the-counter and prescription medicines only as told by your health care provider.  Check with your health care provider before taking any new prescription or over-the-counter medicines.  Decide on a friend, family member, or smoking quit-line (such as 1-800-QUIT-NOW in the [place] that you can call or text when you feel the urge to smoke or when you need help coping with cravings.  Keep all follow-up visits as told by your health care provider and therapist. This is important. Contact a health care provider if:  You are not able to take your medicines as prescribed.  Your symptoms get worse, even with treatment. Summary  Tobacco use disorder (TUD) occurs when a person craves, seeks, and uses tobacco regardless of the consequences.  This condition may be diagnosed based on your current and past tobacco use and a physical exam.  Many people are unable to quit on their own and need help. Recovery can be a long process.  The most effective treatment for TUD is usually a combination of medicine, talk therapy, and support groups. This information is not intended to replace advice given to you by your health care provider. Make sure you discuss any questions you have with your health care provider. Document Released: 06/29/2004 Document Revised: 10/11/2017 Document Reviewed: 10/11/2017 Elsevier Interactive Patient Education  2019 [employer]  

## 2018-11-08 NOTE — Progress Notes (Signed)
Subjective:     Patient ID: Anita Carpenter , female    DOB: [birth year] , 73 y.o.   MRN: [id number]   Chief Complaint  Patient presents with  . Wellbutrin f/u    HPI  She is here today for f/u smoking cessation. She was started on wellbutrin at her last visit. She has tolerated the medication without any side effects. She is down to smoking 3 cigs per day. She admits that it has helped to decrease her cravings.     Past Medical History:  Diagnosis Date  . Asthma   . Diabetes mellitus without complication (HCC)   . High cholesterol   . Hypertension      Family History  Problem Relation Age of Onset  . Asthma Other   . Diabetes Other   . Hyperlipidemia Other   . Hyperlipidemia Mother   . Hypertension Mother   . Kidney failure Father   . Stroke Father      Current Outpatient Medications:  .  amLODipine (NORVASC) 5 MG tablet, Take 5 mg by mouth daily., Disp: , Rfl:  .  buPROPion (WELLBUTRIN XL) 150 MG 24 hr tablet, Take 1 tablet (150 mg total) by mouth every morning., Disp: 30 tablet, Rfl: 2 .  Cholecalciferol (VITAMIN D3) 5000 UNITS CAPS, Take 5,000 Units by mouth daily., Disp: , Rfl:  .  ferrous sulfate 325 (65 FE) MG tablet, Take 1 tablet (325 mg total) by mouth daily with breakfast., Disp: 30 tablet, Rfl: 3 .  glucose blood (ONETOUCH VERIO) test strip, 1 each by Other route daily. Use as instructed, Disp: , Rfl:  .  hydrALAZINE (APRESOLINE) 25 MG tablet, Take 25 mg by mouth daily., Disp: , Rfl:  .  metoprolol tartrate (LOPRESSOR) 25 MG tablet, Take 25 mg by mouth daily., Disp: , Rfl:  .  olmesartan-hydrochlorothiazide (BENICAR HCT) 40-25 MG tablet, Take 1 tablet by mouth daily., Disp: , Rfl:  .  rosuvastatin (CRESTOR) 10 MG tablet, Take 10 mg by mouth daily., Disp: , Rfl:  .  Carbinoxamine Maleate (RYVENT) 6 MG TABS, Take 6 mg by mouth every evening. (Patient not taking: Reported on 11/08/2018), Disp: 30 tablet, Rfl:    Allergies  Allergen Reactions  . Doxycycline Rash      Review of Systems  Constitutional: Negative.   Respiratory: Negative.   Cardiovascular: Negative.   Gastrointestinal: Negative.   Neurological: Negative.   Psychiatric/Behavioral: Negative.      Today's Vitals   11/08/18 1445  BP: (!) 142/90  Pulse: 70  Temp: 98.4 F (36.9 C)  TempSrc: Oral  SpO2: 98%  Weight: 148 lb 6.4 oz (67.3 kg)  Height: 4\' 10"  (1.473 m)  PainSc: 0-No pain   Body mass index is 31.02 kg/m.   Objective:  Physical Exam Vitals signs and nursing note reviewed.  Constitutional:      Appearance: Normal appearance.  Cardiovascular:     Rate and Rhythm: Normal rate and regular rhythm.     Heart sounds: Normal heart sounds.  Pulmonary:     Effort: Pulmonary effort is normal.     Comments: Decreased breath sounds at bases Neurological:     General: No focal deficit present.     Mental Status: She is alert.  Psychiatric:        Mood and Affect: Mood normal.         Assessment And Plan:     1. Cigarette nicotine dependence without complication  Again, importance of smoking cessation was discussed  with the patient. She is down to smoking 3 cigs per day. She is encouraged to pick a quit date within the next 30 days. She does not wish to increase her dose. She will continue with bupropion 150mg  XL once daily. She will rto in 8 weeks for re-evaluation.   2. Hypertensive nephropathy  Fair control. She will continue with current meds for now. She will rto in March 2020 for her next annual wellness visit. She is encouraged to avoid adding salt to her foods.   3. Chronic renal disease, stage II  Chronic. I will recheck this at her next visit.   4. BMI 31/class 1 obesity due to excess calories w/ serious comorbidity  She is encouraged to strive for BMI less than 27 to decrease cardiac risk. She is advised to exercise 30 minutes five days weekly. I will reassess her progress at her next visit in 8 weeks.   Gwynneth Alimentobyn N Tezra Mahr, MD

## 2018-12-24 ENCOUNTER — Other Ambulatory Visit: Payer: Self-pay

## 2018-12-24 MED ORDER — BUPROPION HCL ER (XL) 150 MG PO TB24: 150.0000 mg | ORAL_TABLET | ORAL | 2 refills | Status: DC

## 2019-01-09 ENCOUNTER — Ambulatory Visit (INDEPENDENT_AMBULATORY_CARE_PROVIDER_SITE_OTHER): Payer: Medicare Other

## 2019-01-09 ENCOUNTER — Encounter: Payer: Self-pay | Admitting: Internal Medicine

## 2019-01-09 ENCOUNTER — Ambulatory Visit (INDEPENDENT_AMBULATORY_CARE_PROVIDER_SITE_OTHER): Payer: Medicare Other | Admitting: Internal Medicine

## 2019-01-09 VITALS — BP 144/90 | HR 79 | Temp 98.7°F | Ht <= 58 in | Wt 151.4 lb

## 2019-01-09 DIAGNOSIS — E1122 Type 2 diabetes mellitus with diabetic chronic kidney disease: Secondary | ICD-10-CM

## 2019-01-09 DIAGNOSIS — IMO0002 Reserved for concepts with insufficient information to code with codable children: Secondary | ICD-10-CM

## 2019-01-09 DIAGNOSIS — Z87891 Personal history of nicotine dependence: Secondary | ICD-10-CM | POA: Diagnosis not present

## 2019-01-09 DIAGNOSIS — I129 Hypertensive chronic kidney disease with stage 1 through stage 4 chronic kidney disease, or unspecified chronic kidney disease: Secondary | ICD-10-CM | POA: Diagnosis not present

## 2019-01-09 DIAGNOSIS — N182 Chronic kidney disease, stage 2 (mild): Secondary | ICD-10-CM

## 2019-01-09 DIAGNOSIS — Z6832 Body mass index (BMI) 32.0-32.9, adult: Secondary | ICD-10-CM

## 2019-01-09 DIAGNOSIS — Z0000 Encounter for general adult medical examination without abnormal findings: Secondary | ICD-10-CM | POA: Diagnosis not present

## 2019-01-09 DIAGNOSIS — E1165 Type 2 diabetes mellitus with hyperglycemia: Secondary | ICD-10-CM | POA: Diagnosis not present

## 2019-01-09 DIAGNOSIS — E6609 Other obesity due to excess calories: Secondary | ICD-10-CM

## 2019-01-09 DIAGNOSIS — R0609 Other forms of dyspnea: Secondary | ICD-10-CM | POA: Diagnosis not present

## 2019-01-09 DIAGNOSIS — Z122 Encounter for screening for malignant neoplasm of respiratory organs: Secondary | ICD-10-CM

## 2019-01-09 DIAGNOSIS — E66811 Obesity, class 1: Secondary | ICD-10-CM

## 2019-01-09 LAB — POCT URINALYSIS DIPSTICK
BILIRUBIN UA: NEGATIVE
Blood, UA: NEGATIVE
Glucose, UA: NEGATIVE
Ketones, UA: NEGATIVE
Nitrite, UA: NEGATIVE
Protein, UA: NEGATIVE
Spec Grav, UA: 1.02 (ref 1.010–1.025)
Urobilinogen, UA: 0.2 E.U./dL
pH, UA: 6 (ref 5.0–8.0)

## 2019-01-09 LAB — POCT UA - MICROALBUMIN
CREATININE, POC: 300 mg/dL
Microalbumin Ur, POC: 30 mg/L

## 2019-01-09 NOTE — Progress Notes (Signed)
Subjective:     Patient ID: Anita Carpenter , female    DOB: April 09, 1946 , 73 y.o.   MRN: [id number]   Chief Complaint  Patient presents with  . Diabetes  . Hypertension    HPI  Diabetes  She presents for her follow-up diabetic visit. She has type 2 diabetes mellitus. Her disease course has been stable. There are no hypoglycemic associated symptoms. Pertinent negatives for diabetes include no blurred vision and no chest pain. There are no hypoglycemic complications. Diabetic complications include nephropathy. Risk factors for coronary artery disease include tobacco exposure, diabetes mellitus, dyslipidemia, hypertension, post-menopausal and sedentary lifestyle. She participates in exercise intermittently. An ACE inhibitor/angiotensin II receptor blocker is being taken.  Hypertension  Associated symptoms include shortness of breath (she is happy to state she quit smoking 5 weeks ago. However, she has developed SOB/DOE since then. She reports she was SOB when she had to walk a block. She does not get SOB when walking up steps in her home. Only has issues when outside.). Pertinent negatives include no blurred vision or chest pain.     Past Medical History:  Diagnosis Date  . Asthma   . Diabetes mellitus without complication (Anita Carpenter)   . High cholesterol   . Hypertension      Family History  Problem Relation Age of Onset  . Asthma Other   . Diabetes Other   . Hyperlipidemia Other   . Hyperlipidemia Mother   . Hypertension Mother   . Kidney failure Father   . Stroke Father      Current Outpatient Medications:  .  amLODipine (NORVASC) 5 MG tablet, Take 5 mg by mouth daily., Disp: , Rfl:  .  buPROPion (WELLBUTRIN XL) 150 MG 24 hr tablet, Take 1 tablet (150 mg total) by mouth every morning., Disp: 30 tablet, Rfl: 2 .  Carbinoxamine Maleate (RYVENT) 6 MG TABS, Take 6 mg by mouth every evening. (Patient not taking: Reported on 11/08/2018), Disp: 30 tablet, Rfl:  .  Cholecalciferol (VITAMIN  D3) 5000 UNITS CAPS, Take 5,000 Units by mouth daily., Disp: , Rfl:  .  ferrous sulfate 325 (65 FE) MG tablet, Take 1 tablet (325 mg total) by mouth daily with breakfast. (Patient not taking: Reported on 01/09/2019), Disp: 30 tablet, Rfl: 3 .  glucose blood (ONETOUCH VERIO) test strip, 1 each by Other route daily. Use as instructed, Disp: , Rfl:  .  hydrALAZINE (APRESOLINE) 25 MG tablet, Take 25 mg by mouth daily., Disp: , Rfl:  .  metoprolol tartrate (LOPRESSOR) 25 MG tablet, Take 25 mg by mouth daily., Disp: , Rfl:  .  olmesartan-hydrochlorothiazide (BENICAR HCT) 40-25 MG tablet, Take 1 tablet by mouth daily., Disp: , Rfl:  .  rosuvastatin (CRESTOR) 10 MG tablet, Take 10 mg by mouth daily., Disp: , Rfl:    Allergies  Allergen Reactions  . Doxycycline Rash     Review of Systems  Constitutional: Negative.   Eyes: Negative for blurred vision.  Respiratory: Positive for shortness of breath (she is happy to state she quit smoking 5 weeks ago. However, she has developed SOB/DOE since then. She reports she was SOB when she had to walk a block. She does not get SOB when walking up steps in her home. Only has issues when outside.).   Cardiovascular: Negative.  Negative for chest pain.  Gastrointestinal: Negative.   Neurological: Negative.   Psychiatric/Behavioral: Negative.      Today's Vitals   01/09/19 1531  BP: (!) 144/90  Pulse: 79  Temp: 98.7 F (37.1 C)  TempSrc: Oral  Weight: 151 lb 6.4 oz (68.7 kg)  Height: _0  (1.448 m)   Body mass index is 32.76 kg/m.   Objective:  Physical Exam Vitals signs and nursing note reviewed.  Constitutional:      Appearance: Normal appearance.  HENT:     Head: Normocephalic and atraumatic.  Cardiovascular:     Rate and Rhythm: Normal rate and regular rhythm.     Heart sounds: Normal heart sounds.  Pulmonary:     Effort: Pulmonary effort is normal.     Breath sounds: Normal breath sounds.  Skin:    General: Skin is warm.  Neurological:      General: No focal deficit present.     Mental Status: She is alert.  Psychiatric:        Mood and Affect: Mood normal.        Behavior: Behavior normal.         Assessment And Plan:     1. Type 2 diabetes mellitus with stage 2 chronic kidney disease, without long-term current use of insulin (Anita Carpenter)  I will check labs as listed below.  Importance of dietary, exercise and medication compliance was discussed with the patient.   - CMP14+EGFR - Hemoglobin A1c - Lipid panel  2. Hypertensive nephropathy  Fair control. She will continue with current meds for now. She is encouraged to avoid adding salt to her foods. Importance of regular exercise was discussed with the patient.   3. Chronic renal disease, stage II  Chronic. I will check a GFR, Cr today. She is encouraged to stay well hydrated.   4. DOE (dyspnea on exertion)  No new changes noted on EKG. Due to long-term tobacco use, I will refer her to Pulmonary for PFTs and further evaluation.  Due to cardiac risk factors, I will also refer her for cardiac workup.  She is in agreement with treatment plan.   - EKG 12-Lead - Ambulatory referral to Pulmonology  5. Class 1 obesity due to excess calories with serious comorbidity and body mass index (BMI) of 32.0 to 32.9 in adult  Importance of achieving optimal weight to decrease risk of cardiovascular disease and cancers was discussed with the patient in full detail. She is encouraged to start slowly - start with 10 minutes twice daily at least three to four days per week and to gradually build to 30 minutes five days weekly. She was given tips to incorporate more activity into her daily routine - take stairs when possible, park farther away from her job, grocery stores, etc.   6. Encounter for screening for malignant neoplasm of respiratory organs  She started smoking at age 41. At age 89, reports starting 1/2 ppd - quit 2 years while pregnant - then resumed 1/2 ppd. She quit 5 weeks  ago. She agrees to moving forward with study.   - CT CHEST LUNG CA SCREEN LOW DOSE W/O CM; Future        Anita Greenland, MD

## 2019-01-09 NOTE — Progress Notes (Signed)
Subjective:   Anita Carpenter is a 73 y.o. female who presents for Medicare Annual (Subsequent) preventive examination.  Review of Systems:  n/a Cardiac Risk Factors include: advanced age (>6255men, 6>65 women);diabetes mellitus;hypertension;obesity (BMI >30kg/m2);sedentary lifestyle     Objective:     Vitals: BP (!) 144/90 (BP Location: Left Arm, Patient Position: Sitting)   Pulse 79   Temp 98.7 F (37.1 C) (Oral)   Ht 4\' 9"  (1.448 m)   Wt 151 lb 6.4 oz (68.7 kg)   SpO2 98%   BMI 32.76 kg/m   Body mass index is 32.76 kg/m.  Advanced Directives 01/09/2019 06/10/2014  Does Patient Have a Medical Advance Directive? No Patient does not have advance directive;Patient would not like information  Would patient like information on creating a medical advance directive? Yes (MAU/Ambulatory/Procedural Areas - Information given) -    Tobacco Social History   Tobacco Use  Smoking Status Former Smoker  . Packs/day: 0.25  . Last attempt to quit: 12/03/2018  . Years since quitting: 0.1  Smokeless Tobacco Never Used     Counseling given: Not Answered   Clinical Intake:  Pre-visit preparation completed: Yes  Pain : No/denies pain Pain Score: 0-No pain     Nutritional Status: BMI > 30  Obese Nutritional Risks: None Diabetes: Yes CBG done?: No Did pt. bring in CBG monitor from home?: No  How often do you need to have someone help you when you read instructions, pamphlets, or other written materials from your doctor or pharmacy?: 1 - Never What is the last grade level you completed in school?: some college  Interpreter Needed?: No  Information entered by :: NAllen LPN  Past Medical History:  Diagnosis Date  . Asthma   . Diabetes mellitus without complication (HCC)   . High cholesterol   . Hypertension    Past Surgical History:  Procedure Laterality Date  . TONSILLECTOMY     Family History  Problem Relation Age of Onset  . Asthma Other   . Diabetes Other   .  Hyperlipidemia Other   . Hyperlipidemia Mother   . Hypertension Mother   . Kidney failure Father   . Stroke Father    Social History   Socioeconomic History  . Marital status: Divorced    Spouse name: Not on file  . Number of children: Not on file  . Years of education: Not on file  . Highest education level: Not on file  Occupational History  . Occupation: retired  Engineer, productionocial Needs  . Financial resource strain: Not hard at all  . Food insecurity:    Worry: Never true    Inability: Never true  . Transportation needs:    Medical: No    Non-medical: No  Tobacco Use  . Smoking status: Former Smoker    Packs/day: 0.25    Last attempt to quit: 12/03/2018    Years since quitting: 0.1  . Smokeless tobacco: Never Used  Substance and Sexual Activity  . Alcohol use: Not Currently  . Drug use: Never  . Sexual activity: Not Currently  Lifestyle  . Physical activity:    Days per week: 0 days    Minutes per session: 0 min  . Stress: Not at all  Relationships  . Social connections:    Talks on phone: Not on file    Gets together: Not on file    Attends religious service: Not on file    Active member of club or organization: Not on file  Attends meetings of clubs or organizations: Not on file    Relationship status: Not on file  Other Topics Concern  . Not on file  Social History Narrative  . Not on file    Outpatient Encounter Medications as of 01/09/2019  Medication Sig  . amLODipine (NORVASC) 5 MG tablet Take 5 mg by mouth daily.  Marland Kitchen buPROPion (WELLBUTRIN XL) 150 MG 24 hr tablet Take 1 tablet (150 mg total) by mouth every morning.  . Cholecalciferol (VITAMIN D3) 5000 UNITS CAPS Take 5,000 Units by mouth daily.  Marland Kitchen glucose blood (ONETOUCH VERIO) test strip 1 each by Other route daily. Use as instructed  . hydrALAZINE (APRESOLINE) 25 MG tablet Take 25 mg by mouth daily.  . metoprolol tartrate (LOPRESSOR) 25 MG tablet Take 25 mg by mouth daily.  Marland Kitchen olmesartan-hydrochlorothiazide  (BENICAR HCT) 40-25 MG tablet Take 1 tablet by mouth daily.  . rosuvastatin (CRESTOR) 10 MG tablet Take 10 mg by mouth daily.  . Carbinoxamine Maleate (RYVENT) 6 MG TABS Take 6 mg by mouth every evening. (Patient not taking: Reported on 11/08/2018)  . ferrous sulfate 325 (65 FE) MG tablet Take 1 tablet (325 mg total) by mouth daily with breakfast. (Patient not taking: Reported on 01/09/2019)   No facility-administered encounter medications on file as of 01/09/2019.     Activities of Daily Living In your present state of health, do you have any difficulty performing the following activities: 01/09/2019  Hearing? Y  Comment a little going to get it checked  Vision? N  Difficulty concentrating or making decisions? N  Walking or climbing stairs? N  Dressing or bathing? N  Doing errands, shopping? N  Preparing Food and eating ? N  Using the Toilet? N  In the past six months, have you accidently leaked urine? Y  Comment has urgency to go  Do you have problems with loss of bowel control? N  Managing your Medications? N  Managing your Finances? N  Housekeeping or managing your Housekeeping? N  Some recent data might be hidden    Patient Care Team: Dorothyann Peng, MD as PCP - General (Internal Medicine)    Assessment:   This is a routine wellness examination for [name].  Exercise Activities and Dietary recommendations Current Exercise Habits: The patient does not participate in regular exercise at present, Exercise limited by: None identified  Goals    . Patient Stated (pt-stated)     Wants to be more active.       Fall Risk Fall Risk  01/09/2019 09/17/2018 06/10/2014  Falls in the past year? 0 0 No  Risk for fall due to : Medication side effect - -  Follow up Education provided;Falls prevention discussed - -   Is the patient's home free of loose throw rugs in walkways, pet beds, electrical cords, etc?   yes      Grab bars in the bathroom? no      Handrails on the stairs? n/a       Adequate lighting?   yes  Timed Get Up and Go performed:  n/a  Depression Screen PHQ 2/9 Scores 01/09/2019 11/08/2018 09/17/2018 06/10/2014  PHQ - 2 Score 0 0 0 0  PHQ- 9 Score 0 - - -     Cognitive Function     6CIT Screen 01/09/2019  What Year? 0 points  What month? 0 points  What time? 0 points  Count back from 20 0 points  Months in reverse 0 points  Repeat phrase 0  points  Total Score 0    Immunization History  Administered Date(s) Administered  . Influenza-Unspecified 07/20/2018    Qualifies for Shingles Vaccine?   Screening Tests Health Maintenance  Topic Date Due  . FOOT EXAM  07/05/1957  . PNA vac Low Risk Adult (1 of 2 - PCV13) 01/09/2020 (Originally 07/05/2012)  . HEMOGLOBIN A1C  03/18/2019  . COLONOSCOPY  04/09/2019  . OPHTHALMOLOGY EXAM  07/18/2019  . MAMMOGRAM  08/17/2020  . TETANUS/TDAP  08/06/2023  . INFLUENZA VACCINE  Completed  . DEXA SCAN  Completed  . Hepatitis C Screening  Completed    Cancer Screenings: Lung: Low Dose CT Chest recommended if Age 10-80 years, 30 pack-year currently smoking OR have quit w/in 15years. Patient does qualify. Breast:  Up to date on Mammogram? Yes   Up to date of Bone Density/Dexa? Yes Colorectal: up to date  Additional Screenings: : Hepatitis C Screening: 05/05/2014 0.2     Plan:   Wants to be more active.  I have personally reviewed and noted the following in the patient's chart:   . Medical and social history . Use of alcohol, tobacco or illicit drugs  . Current medications and supplements . Functional ability and status . Nutritional status . Physical activity . Advanced directives . List of other physicians . Hospitalizations, surgeries, and ER visits in previous 12 months . Vitals . Screenings to include cognitive, depression, and falls . Referrals and appointments  In addition, I have reviewed and discussed with patient certain preventive protocols, quality metrics, and best practice  recommendations. A written personalized care plan for preventive services as well as general preventive health recommendations were provided to patient.     Barb Merino, LPN  11/13/107

## 2019-01-09 NOTE — Patient Instructions (Signed)
Diabetes Mellitus and Nutrition, Adult  When you have diabetes (diabetes mellitus), it is very important to have healthy eating habits because your blood sugar (glucose) levels are greatly affected by what you eat and drink. Eating healthy foods in the appropriate amounts, at about the same times every day, can help you:  · Control your blood glucose.  · Lower your risk of heart disease.  · Improve your blood pressure.  · Reach or maintain a healthy weight.  Every person with diabetes is different, and each person has different needs for a meal plan. Your health care provider may recommend that you work with a diet and nutrition specialist (dietitian) to make a meal plan that is best for you. Your meal plan may vary depending on factors such as:  · The calories you need.  · The medicines you take.  · Your weight.  · Your blood glucose, blood pressure, and cholesterol levels.  · Your activity level.  · Other health conditions you have, such as heart or kidney disease.  How do carbohydrates affect me?  Carbohydrates, also called carbs, affect your blood glucose level more than any other type of food. Eating carbs naturally raises the amount of glucose in your blood. Carb counting is a method for keeping track of how many carbs you eat. Counting carbs is important to keep your blood glucose at a healthy level, especially if you use insulin or take certain oral diabetes medicines.  It is important to know how many carbs you can safely have in each meal. This is different for every person. Your dietitian can help you calculate how many carbs you should have at each meal and for each snack.  Foods that contain carbs include:  · Bread, cereal, rice, pasta, and crackers.  · Potatoes and corn.  · Peas, beans, and lentils.  · Milk and yogurt.  · Fruit and juice.  · Desserts, such as cakes, cookies, ice cream, and candy.  How does alcohol affect me?  Alcohol can cause a sudden decrease in blood glucose (hypoglycemia),  especially if you use insulin or take certain oral diabetes medicines. Hypoglycemia can be a life-threatening condition. Symptoms of hypoglycemia (sleepiness, dizziness, and confusion) are similar to symptoms of having too much alcohol.  If your health care provider says that alcohol is safe for you, follow these guidelines:  · Limit alcohol intake to no more than 1 drink per day for nonpregnant women and 2 drinks per day for men. One drink equals 12 oz of beer, 5 oz of wine, or 1½ oz of hard liquor.  · Do not drink on an empty stomach.  · Keep yourself hydrated with water, diet soda, or unsweetened iced tea.  · Keep in mind that regular soda, juice, and other mixers may contain a lot of sugar and must be counted as carbs.  What are tips for following this plan?    Reading food labels  · Start by checking the serving size on the "Nutrition Facts" label of packaged foods and drinks. The amount of calories, carbs, fats, and other nutrients listed on the label is based on one serving of the item. Many items contain more than one serving per package.  · Check the total grams (g) of carbs in one serving. You can calculate the number of servings of carbs in one serving by dividing the total carbs by 15. For example, if a food has 30 g of total carbs, it would be equal to 2   servings of carbs.  · Check the number of grams (g) of saturated and trans fats in one serving. Choose foods that have low or no amount of these fats.  · Check the number of milligrams (mg) of salt (sodium) in one serving. Most people should limit total sodium intake to less than 2,300 mg per day.  · Always check the nutrition information of foods labeled as "low-fat" or "nonfat". These foods may be higher in added sugar or refined carbs and should be avoided.  · Talk to your dietitian to identify your daily goals for nutrients listed on the label.  Shopping  · Avoid buying canned, premade, or processed foods. These foods tend to be high in fat, sodium,  and added sugar.  · Shop around the outside edge of the grocery store. This includes fresh fruits and vegetables, bulk grains, fresh meats, and fresh dairy.  Cooking  · Use low-heat cooking methods, such as baking, instead of high-heat cooking methods like deep frying.  · Cook using healthy oils, such as olive, canola, or sunflower oil.  · Avoid cooking with butter, cream, or high-fat meats.  Meal planning  · Eat meals and snacks regularly, preferably at the same times every day. Avoid going long periods of time without eating.  · Eat foods high in fiber, such as fresh fruits, vegetables, beans, and whole grains. Talk to your dietitian about how many servings of carbs you can eat at each meal.  · Eat 4-6 ounces (oz) of lean protein each day, such as lean meat, chicken, fish, eggs, or tofu. One oz of lean protein is equal to:  ? 1 oz of meat, chicken, or fish.  ? 1 egg.  ? ¼ cup of tofu.  · Eat some foods each day that contain healthy fats, such as avocado, nuts, seeds, and fish.  Lifestyle  · Check your blood glucose regularly.  · Exercise regularly as told by your health care provider. This may include:  ? 150 minutes of moderate-intensity or vigorous-intensity exercise each week. This could be brisk walking, biking, or water aerobics.  ? Stretching and doing strength exercises, such as yoga or weightlifting, at least 2 times a week.  · Take medicines as told by your health care provider.  · Do not use any products that contain nicotine or tobacco, such as cigarettes and e-cigarettes. If you need help quitting, ask your health care provider.  · Work with a counselor or diabetes educator to identify strategies to manage stress and any emotional and social challenges.  Questions to ask a health care provider  · Do I need to meet with a diabetes educator?  · Do I need to meet with a dietitian?  · What number can I call if I have questions?  · When are the best times to check my blood glucose?  Where to find more  information:  · American Diabetes Association: diabetes.org  · Academy of Nutrition and Dietetics: www.eatright.org  · National Institute of Diabetes and Digestive and Kidney Diseases (NIH): www.niddk.nih.gov  Summary  · A healthy meal plan will help you control your blood glucose and maintain a healthy lifestyle.  · Working with a diet and nutrition specialist (dietitian) can help you make a meal plan that is best for you.  · Keep in mind that carbohydrates (carbs) and alcohol have immediate effects on your blood glucose levels. It is important to count carbs and to use alcohol carefully.  This information is not intended to   replace advice given to you by your health care provider. Make sure you discuss any questions you have with your health care provider.  Document Released: 07/21/2005 Document Revised: 05/24/2017 Document Reviewed: 11/28/2016  Elsevier Interactive Patient Education © 2019 Elsevier Inc.

## 2019-01-09 NOTE — Patient Instructions (Signed)
Anita Carpenter , Thank you for taking time to come for your Medicare Wellness Visit. I appreciate your ongoing commitment to your health goals. Please review the following plan we discussed and let me know if I can assist you in the future.   Screening recommendations/referrals: Colonoscopy: 05/2012 Mammogram: 08/2018 Bone Density: 10/2016 Recommended yearly ophthalmology/optometry visit for glaucoma screening and checkup Recommended yearly dental visit for hygiene and checkup  Vaccinations: Influenza vaccine: 07/2018 Pneumococcal vaccine: decline Tdap vaccine: 07/2013 Shingles vaccine: discussed    Advanced directives: Advance directive discussed with you today. I have provided a copy for you to complete at home and have notarized. Once this is complete please bring a copy in to our office so we can scan it into your chart.   Conditions/risks identified: Obesity  Next appointment: 05/14/2019 at 200   Preventive Care 65 Years and Older, Female Preventive care refers to lifestyle choices and visits with your health care provider that can promote health and wellness. What does preventive care include?  A yearly physical exam. This is also called an annual well check.  Dental exams once or twice a year.  Routine eye exams. Ask your health care provider how often you should have your eyes checked.  Personal lifestyle choices, including:  Daily care of your teeth and gums.  Regular physical activity.  Eating a healthy diet.  Avoiding tobacco and drug use.  Limiting alcohol use.  Practicing safe sex.  Taking low-dose aspirin every day.  Taking vitamin and mineral supplements as recommended by your health care provider. What happens during an annual well check? The services and screenings done by your health care provider during your annual well check will depend on your age, overall health, lifestyle risk factors, and family history of disease. Counseling  Your health care  provider may ask you questions about your:  Alcohol use.  Tobacco use.  Drug use.  Emotional well-being.  Home and relationship well-being.  Sexual activity.  Eating habits.  History of falls.  Memory and ability to understand (cognition).  Work and work Astronomer.  Reproductive health. Screening  You may have the following tests or measurements:  Height, weight, and BMI.  Blood pressure.  Lipid and cholesterol levels. These may be checked every 5 years, or more frequently if you are over 60 years old.  Skin check.  Lung cancer screening. You may have this screening every year starting at age 67 if you have a 30-pack-year history of smoking and currently smoke or have quit within the past 15 years.  Fecal occult blood test (FOBT) of the stool. You may have this test every year starting at age 52.  Flexible sigmoidoscopy or colonoscopy. You may have a sigmoidoscopy every 5 years or a colonoscopy every 10 years starting at age 70.  Hepatitis C blood test.  Hepatitis B blood test.  Sexually transmitted disease (STD) testing.  Diabetes screening. This is done by checking your blood sugar (glucose) after you have not eaten for a while (fasting). You may have this done every 1-3 years.  Bone density scan. This is done to screen for osteoporosis. You may have this done starting at age 74.  Mammogram. This may be done every 1-2 years. Talk to your health care provider about how often you should have regular mammograms. Talk with your health care provider about your test results, treatment options, and if necessary, the need for more tests. Vaccines  Your health care provider may recommend certain vaccines, such as:  Influenza vaccine. This is recommended every year.  Tetanus, diphtheria, and acellular pertussis (Tdap, Td) vaccine. You may need a Td booster every 10 years.  Zoster vaccine. You may need this after age 44.  Pneumococcal 13-valent conjugate (PCV13)  vaccine. One dose is recommended after age 64.  Pneumococcal polysaccharide (PPSV23) vaccine. One dose is recommended after age 59. Talk to your health care provider about which screenings and vaccines you need and how often you need them. This information is not intended to replace advice given to you by your health care provider. Make sure you discuss any questions you have with your health care provider. Document Released: 11/20/2015 Document Revised: 07/13/2016 Document Reviewed: 08/25/2015 Elsevier Interactive Patient Education  2017 [place] Prevention in the Home Falls can cause injuries. They can happen to people of all ages. There are many things you can do to make your home safe and to help prevent falls. What can I do on the outside of my home?  Regularly fix the edges of walkways and driveways and fix any cracks.  Remove anything that might make you trip as you walk through a door, such as a raised step or threshold.  Trim any bushes or trees on the path to your home.  Use bright outdoor lighting.  Clear any walking paths of anything that might make someone trip, such as rocks or tools.  Regularly check to see if handrails are loose or broken. Make sure that both sides of any steps have handrails.  Any raised decks and porches should have guardrails on the edges.  Have any leaves, snow, or ice cleared regularly.  Use sand or salt on walking paths during winter.  Clean up any spills in your garage right away. This includes oil or grease spills. What can I do in the bathroom?  Use night lights.  Install grab bars by the toilet and in the tub and shower. Do not use towel bars as grab bars.  Use non-skid mats or decals in the tub or shower.  If you need to sit down in the shower, use a plastic, non-slip stool.  Keep the floor dry. Clean up any water that spills on the floor as soon as it happens.  Remove soap buildup in the tub or shower  regularly.  Attach bath mats securely with double-sided non-slip rug tape.  Do not have throw rugs and other things on the floor that can make you trip. What can I do in the bedroom?  Use night lights.  Make sure that you have a light by your bed that is easy to reach.  Do not use any sheets or blankets that are too big for your bed. They should not hang down onto the floor.  Have a firm chair that has side arms. You can use this for support while you get dressed.  Do not have throw rugs and other things on the floor that can make you trip. What can I do in the kitchen?  Clean up any spills right away.  Avoid walking on wet floors.  Keep items that you use a lot in easy-to-reach places.  If you need to reach something above you, use a strong step stool that has a grab bar.  Keep electrical cords out of the way.  Do not use floor polish or wax that makes floors slippery. If you must use wax, use non-skid floor wax.  Do not have throw rugs and other things on the floor that  can make you trip. What can I do with my stairs?  Do not leave any items on the stairs.  Make sure that there are handrails on both sides of the stairs and use them. Fix handrails that are broken or loose. Make sure that handrails are as long as the stairways.  Check any carpeting to make sure that it is firmly attached to the stairs. Fix any carpet that is loose or worn.  Avoid having throw rugs at the top or bottom of the stairs. If you do have throw rugs, attach them to the floor with carpet tape.  Make sure that you have a light switch at the top of the stairs and the bottom of the stairs. If you do not have them, ask someone to add them for you. What else can I do to help prevent falls?  Wear shoes that:  Do not have high heels.  Have rubber bottoms.  Are comfortable and fit you well.  Are closed at the toe. Do not wear sandals.  If you use a stepladder:  Make sure that it is fully  opened. Do not climb a closed stepladder.  Make sure that both sides of the stepladder are locked into place.  Ask someone to hold it for you, if possible.  Clearly mark and make sure that you can see:  Any grab bars or handrails.  First and last steps.  Where the edge of each step is.  Use tools that help you move around (mobility aids) if they are needed. These include:  Canes.  Walkers.  Scooters.  Crutches.  Turn on the lights when you go into a dark area. Replace any light bulbs as soon as they burn out.  Set up your furniture so you have a clear path. Avoid moving your furniture around.  If any of your floors are uneven, fix them.  If there are any pets around you, be aware of where they are.  Review your medicines with your doctor. Some medicines can make you feel dizzy. This can increase your chance of falling. Ask your doctor what other things that you can do to help prevent falls. This information is not intended to replace advice given to you by your health care provider. Make sure you discuss any questions you have with your health care provider. Document Released: 08/20/2009 Document Revised: 03/31/2016 Document Reviewed: 11/28/2014 Elsevier Interactive Patient Education  2017 [employer].

## 2019-01-10 LAB — LIPID PANEL
CHOLESTEROL TOTAL: 150 mg/dL (ref 100–199)
Chol/HDL Ratio: 2.4 ratio (ref 0.0–4.4)
HDL: 62 mg/dL (ref >39)
LDL Calculated: 59 mg/dL (ref 0–99)
TRIGLYCERIDES: 145 mg/dL (ref 0–149)
VLDL Cholesterol Cal: 29 mg/dL (ref 5–40)

## 2019-01-10 LAB — CMP14+EGFR
ALBUMIN: 4.4 g/dL (ref 3.7–4.7)
ALK PHOS: 96 IU/L (ref 39–117)
ALT: 16 IU/L (ref 0–32)
AST: 19 IU/L (ref 0–40)
Albumin/Globulin Ratio: 1.6 (ref 1.2–2.2)
BILIRUBIN TOTAL: 0.5 mg/dL (ref 0.0–1.2)
BUN/Creatinine Ratio: 18 (ref 12–28)
BUN: 23 mg/dL (ref 8–27)
CO2: 23 mmol/L (ref 20–29)
Calcium: 9.8 mg/dL (ref 8.7–10.3)
Chloride: 106 mmol/L (ref 96–106)
Creatinine, Ser: 1.27 mg/dL — ABNORMAL HIGH (ref 0.57–1.00)
GFR calc Af Amer: 49 mL/min/{1.73_m2} — ABNORMAL LOW (ref >59)
GFR calc non Af Amer: 43 mL/min/{1.73_m2} — ABNORMAL LOW (ref >59)
GLUCOSE: 103 mg/dL — AB (ref 65–99)
Globulin, Total: 2.8 g/dL (ref 1.5–4.5)
Potassium: 4.4 mmol/L (ref 3.5–5.2)
Sodium: 143 mmol/L (ref 134–144)
Total Protein: 7.2 g/dL (ref 6.0–8.5)

## 2019-01-10 LAB — HEMOGLOBIN A1C
ESTIMATED AVERAGE GLUCOSE: 137 mg/dL
HEMOGLOBIN A1C: 6.4 % — AB (ref 4.8–5.6)

## 2019-01-11 ENCOUNTER — Encounter: Payer: Self-pay | Admitting: Internal Medicine

## 2019-01-11 ENCOUNTER — Ambulatory Visit: Payer: Medicare Other | Admitting: Internal Medicine

## 2019-02-01 ENCOUNTER — Ambulatory Visit: Payer: Self-pay

## 2019-02-18 ENCOUNTER — Other Ambulatory Visit: Payer: Self-pay

## 2019-02-18 MED ORDER — ROSUVASTATIN CALCIUM 10 MG PO TABS: 10.0000 mg | ORAL_TABLET | Freq: Every day | ORAL | 1 refills | Status: DC

## 2019-02-27 ENCOUNTER — Encounter: Payer: Self-pay | Admitting: Internal Medicine

## 2019-03-25 ENCOUNTER — Ambulatory Visit: Payer: Self-pay

## 2019-04-08 ENCOUNTER — Ambulatory Visit: Payer: Self-pay

## 2019-05-07 ENCOUNTER — Other Ambulatory Visit: Payer: Self-pay

## 2019-05-07 ENCOUNTER — Ambulatory Visit (INDEPENDENT_AMBULATORY_CARE_PROVIDER_SITE_OTHER): Payer: Medicare Other

## 2019-05-07 ENCOUNTER — Encounter: Payer: Self-pay | Admitting: Emergency Medicine

## 2019-05-07 ENCOUNTER — Ambulatory Visit (INDEPENDENT_AMBULATORY_CARE_PROVIDER_SITE_OTHER): Payer: Medicare Other | Admitting: Emergency Medicine

## 2019-05-07 VITALS — BP 124/80 | HR 82 | Ht <= 58 in | Wt 159.0 lb

## 2019-05-07 DIAGNOSIS — R0600 Dyspnea, unspecified: Secondary | ICD-10-CM

## 2019-05-07 DIAGNOSIS — R0602 Shortness of breath: Secondary | ICD-10-CM

## 2019-05-07 DIAGNOSIS — Z87891 Personal history of nicotine dependence: Secondary | ICD-10-CM | POA: Insufficient documentation

## 2019-05-07 DIAGNOSIS — R0789 Other chest pain: Secondary | ICD-10-CM

## 2019-05-07 HISTORY — DX: Personal history of nicotine dependence: Z87.891

## 2019-05-07 HISTORY — DX: Dyspnea, unspecified: R06.00

## 2019-05-07 NOTE — Assessment & Plan Note (Signed)
She had childhood asthma, a small tobacco history.  Certainly she is at risk for COPD.  We will assess with pulmonary function testing, chest x-ray today.  If she continues to have symptoms and higher pulmonary work-up is unrevealing then I will refer her to cardiology.  She did have a reassuring nuclear cardiac stress test in March 2015.  I suspect that some of her chest discomfort may be GERD related.

## 2019-05-07 NOTE — Assessment & Plan Note (Signed)
She stopped 6 months ago.  I congratulated her on this.  She did not smoke heavily but did smoke for 20 years.  Approximately 4-5 pack years.  Based on this she would not qualify for LD-CT lung cancer screening

## 2019-05-07 NOTE — Progress Notes (Signed)
Subjective:    Patient ID: Anita Carpenter, female    DOB: 1946-01-07, 73 y.o.   MRN: [id number]  HPI 73 year old former smoker (4-5 pack years) with a history of hypertension, diabetes, hyperlipidemia, GERD.  She carries a history of childhood asthma.  She is referred today for evaluation of shortness of breath.  She began to notice exertional chest tightness, dyspnea, some discomfort into her neck when walking an incline, carrying packages. Better with rest for a few minutes. She hasn't had these sx recently, but notes that her exercise has decreased significantly since COVID isolation. No cough, sputum, no wheeze. She has rare GERD sx, not really active unless she eats late or spicy foods.   Nuclear cardiac stress test 01/24/2014 >> low risk study, normal LV fxn  She is scheduled for LDCT screening, but she doesn't qualify based on pack-yrs above.    Review of Systems  Constitutional: Negative for fever and unexpected weight change.  HENT: Negative for congestion, dental problem, ear pain, nosebleeds, postnasal drip, rhinorrhea, sinus pressure, sneezing, sore throat and trouble swallowing.   Eyes: Negative for redness and itching.  Respiratory: Positive for chest tightness and shortness of breath. Negative for cough and wheezing.   Cardiovascular: Positive for palpitations. Negative for leg swelling.  Gastrointestinal: Negative for nausea and vomiting.  Genitourinary: Negative for dysuria.  Musculoskeletal: Negative for joint swelling.  Skin: Negative for rash.  Neurological: Negative for headaches.  Hematological: Does not bruise/bleed easily.  Psychiatric/Behavioral: Negative for dysphoric mood. The patient is not nervous/anxious.     Past Medical History:  Diagnosis Date  . Asthma   . Diabetes mellitus without complication ([place])   . High cholesterol   . Hypertension      Family History  Problem Relation Age of Onset  . Asthma Other   . Diabetes Other   . Hyperlipidemia  Other   . Hyperlipidemia Mother   . Hypertension Mother   . Kidney failure Father   . Stroke Father      Social History   Socioeconomic History  . Marital status: Divorced    Spouse name: Not on file  . Number of children: Not on file  . Years of education: Not on file  . Highest education level: Not on file  Occupational History  . Occupation: retired  Scientific laboratory technician  . Financial resource strain: Not hard at all  . Food insecurity    Worry: Never true    Inability: Never true  . Transportation needs    Medical: No    Non-medical: No  Tobacco Use  . Smoking status: Former Smoker    Packs/day: 0.25    Years: 20.00    Pack years: 5.00    Types: Cigarettes    Quit date: 12/03/2018    Years since quitting: 0.4  . Smokeless tobacco: Never Used  Substance and Sexual Activity  . Alcohol use: Not Currently  . Drug use: Never  . Sexual activity: Not Currently  Lifestyle  . Physical activity    Days per week: 0 days    Minutes per session: 0 min  . Stress: Not at all  Relationships  . Social Herbalist on phone: Not on file    Gets together: Not on file    Attends religious service: Not on file    Active member of club or organization: Not on file    Attends meetings of clubs or organizations: Not on file    Relationship status:  Not on file  . Intimate partner violence    Fear of current or ex partner: No    Emotionally abused: No    Physically abused: No    Forced sexual activity: No  Other Topics Concern  . Not on file  Social History Narrative  . 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 Whether she may have been exposed to asbestos through work.  [place] native   Allergies  Allergen Reactions  . Doxycycline Rash     Outpatient Medications Prior to Visit  Medication Sig Dispense Refill  . amLODipine (NORVASC) 5 MG tablet Take 5 mg by mouth daily.    . Cholecalciferol (VITAMIN D3) 5000 UNITS CAPS Take 5,000 Units by mouth  daily.    Marland Kitchen. glucose blood (ONETOUCH VERIO) test strip 1 each by Other route daily. Use as instructed    . hydrALAZINE (APRESOLINE) 25 MG tablet Take 25 mg by mouth daily.    . metoprolol tartrate (LOPRESSOR) 25 MG tablet Take 25 mg by mouth daily.    Marland Kitchen. olmesartan-hydrochlorothiazide (BENICAR HCT) 40-25 MG tablet Take 1 tablet by mouth daily.    . rosuvastatin (CRESTOR) 10 MG tablet Take 1 tablet (10 mg total) by mouth daily. 90 tablet 1  . buPROPion (WELLBUTRIN XL) 150 MG 24 hr tablet Take 1 tablet (150 mg total) by mouth every morning. 30 tablet 2  . ferrous sulfate 325 (65 FE) MG tablet Take 1 tablet (325 mg total) by mouth daily with breakfast. (Patient not taking: Reported on 01/09/2019) 30 tablet 3  . Carbinoxamine Maleate (RYVENT) 6 MG TABS Take 6 mg by mouth every evening. (Patient not taking: Reported on 11/08/2018) 30 tablet    No facility-administered medications prior to visit.          Objective:   Physical Exam Vitals:   05/07/19 1130  BP: 124/80  Pulse: 82  SpO2: 98%  Weight: 159 lb (72.1 kg)  Height: 4\' 10"  (1.473 m)   Gen: Pleasant, overwt, in no distress,  normal affect  ENT: No lesions,  mouth clear,  oropharynx clear, no postnasal drip  Neck: No JVD, no stridor  Lungs: No use of accessory muscles, no crackles or wheezing on normal respiration, no wheeze on forced expiration  Cardiovascular: RRR, heart sounds normal, no murmur or gallops, no peripheral edema  Musculoskeletal: No deformities, no cyanosis or clubbing  Neuro: alert, awake, non focal  Skin: Warm, no lesions or rash     Assessment & Plan:  Dyspnea She had childhood asthma, a small tobacco history.  Certainly she is at risk for COPD.  We will assess with pulmonary function testing, chest x-ray today.  If she continues to have symptoms and higher pulmonary work-up is unrevealing then I will refer her to cardiology.  She did have a reassuring nuclear cardiac stress test in March 2015.  I suspect  that some of her chest discomfort may be GERD related.  Chest pain Some of the symptoms are reminiscent of GERD.  If her pulmonary evaluation is unrevealing I will refer her to cardiology  History of tobacco use She stopped 6 months ago.  I congratulated her on this.  She did not smoke heavily but did smoke for 20 years.  Approximately 4-5 pack years.  Based on this she would not qualify for LD-CT lung cancer screening  Levy Pupaobert Byrum, MD, PhD 05/07/2019, 12:23 PM [facility] [phone] or if no answer [phone]

## 2019-05-07 NOTE — Patient Instructions (Signed)
We will perform a chest x-ray today We will arrange for pulmonary function testing. Depending on your test results we will decide whether we need repeat any of your previous heart testing. Follow with Dr. Lamonte Sakai next available with full pulmonary function testing on the same day.

## 2019-05-07 NOTE — Assessment & Plan Note (Signed)
Some of the symptoms are reminiscent of GERD.  If her pulmonary evaluation is unrevealing I will refer her to cardiology

## 2019-05-14 ENCOUNTER — Ambulatory Visit: Payer: Medicare Other | Admitting: Internal Medicine

## 2019-05-20 ENCOUNTER — Ambulatory Visit
Admission: RE | Admit: 2019-05-20 | Discharge: 2019-05-20 | Disposition: A | Discharge status: Home / Self Care | Payer: Medicare Other | Source: Ambulatory Visit | Attending: Internal Medicine | Admitting: Internal Medicine

## 2019-05-20 DIAGNOSIS — Z122 Encounter for screening for malignant neoplasm of respiratory organs: Secondary | ICD-10-CM

## 2019-05-22 ENCOUNTER — Other Ambulatory Visit: Payer: Self-pay

## 2019-05-22 DIAGNOSIS — R911 Solitary pulmonary nodule: Secondary | ICD-10-CM

## 2019-05-22 DIAGNOSIS — E0789 Other specified disorders of thyroid: Secondary | ICD-10-CM

## 2019-05-22 DIAGNOSIS — E079 Disorder of thyroid, unspecified: Secondary | ICD-10-CM

## 2019-06-03 ENCOUNTER — Institutional Professional Consult (permissible substitution): Payer: Self-pay | Admitting: Emergency Medicine

## 2019-06-05 ENCOUNTER — Ambulatory Visit (INDEPENDENT_AMBULATORY_CARE_PROVIDER_SITE_OTHER): Payer: Medicare Other | Admitting: Internal Medicine

## 2019-06-05 ENCOUNTER — Encounter: Payer: Self-pay | Admitting: Internal Medicine

## 2019-06-05 ENCOUNTER — Other Ambulatory Visit: Payer: Self-pay

## 2019-06-05 VITALS — BP 122/86 | HR 81 | Temp 98.9°F | Ht <= 58 in | Wt 158.6 lb

## 2019-06-05 DIAGNOSIS — R911 Solitary pulmonary nodule: Secondary | ICD-10-CM

## 2019-06-05 DIAGNOSIS — N182 Chronic kidney disease, stage 2 (mild): Secondary | ICD-10-CM

## 2019-06-05 DIAGNOSIS — E1122 Type 2 diabetes mellitus with diabetic chronic kidney disease: Secondary | ICD-10-CM

## 2019-06-05 DIAGNOSIS — I129 Hypertensive chronic kidney disease with stage 1 through stage 4 chronic kidney disease, or unspecified chronic kidney disease: Secondary | ICD-10-CM | POA: Diagnosis not present

## 2019-06-05 DIAGNOSIS — Z87891 Personal history of nicotine dependence: Secondary | ICD-10-CM

## 2019-06-05 DIAGNOSIS — R600 Localized edema: Secondary | ICD-10-CM

## 2019-06-05 DIAGNOSIS — F17210 Nicotine dependence, cigarettes, uncomplicated: Secondary | ICD-10-CM

## 2019-06-05 NOTE — Patient Instructions (Signed)
Pulmonary Nodule A pulmonary nodule is tissue that has grown on your lung. A nodule may be cancer, but most nodules are not cancer. Follow these instructions at home:   Take over-the-counter and prescription medicines only as told by your doctor.  Do not use any products that have nicotine or tobacco, such as cigarettes and e-cigarettes. If you need help quitting, ask your doctor.  Keep all follow-up visits as told by your doctor. This is important. Contact a doctor if:  You have trouble breathing when doing activities.  You feel sick.  You feel more tired than normal.  You do not feel like eating.  You lose weight without trying.  You have chills.  You have night sweats. Get help right away if:  You cannot catch your breath.  You start making whistling sounds when breathing (wheezing).  You cannot stop coughing.  You cough up blood.  You get dizzy.  You feel like you are going to pass out (faint).  You have sudden chest pain.  You have a fever or symptoms for more than 2-3 days.  You have a fever and your symptoms suddenly get worse. Summary  A pulmonary nodule is tissue that has grown on your lung.  Most nodules are not cancer.  Your doctor will do tests to know what kind of nodule you have, and whether you need treatment for it. This information is not intended to replace advice given to you by your health care provider. Make sure you discuss any questions you have with your health care provider. Document Released: 11/26/2010 Document Revised: 11/17/2017 Document Reviewed: 11/22/2016 Elsevier Patient Education  2020 Elsevier Inc.  

## 2019-06-05 NOTE — Progress Notes (Signed)
Subjective:     Patient ID: Anita Carpenter , female    DOB: Sep 07, 1946 , 73 y.o.   MRN: [id number]   Chief Complaint  Patient presents with  . Hypertension  . Diabetes    HPI  Diabetes She presents for her follow-up diabetic visit. She has type 2 diabetes mellitus. Her disease course has been stable. There are no hypoglycemic associated symptoms. Pertinent negatives for diabetes include no blurred vision and no chest pain. There are no hypoglycemic complications. Diabetic complications include nephropathy. Risk factors for coronary artery disease include tobacco exposure, diabetes mellitus, dyslipidemia, hypertension, post-menopausal and sedentary lifestyle. She participates in exercise intermittently. An ACE inhibitor/angiotensin II receptor blocker is being taken.  Hypertension Associated symptoms include shortness of breath (she is happy to state she quit smoking 5 weeks ago. However, she has developed SOB/DOE since then. She reports she was SOB when she had to walk a block. She does not get SOB when walking up steps in her home. Only has issues when outside.). Pertinent negatives include no blurred vision or chest pain.     Past Medical History:  Diagnosis Date  . Asthma   . Diabetes mellitus without complication ([place])   . High cholesterol   . Hypertension      Family History  Problem Relation Age of Onset  . Asthma Other   . Diabetes Other   . Hyperlipidemia Other   . Hyperlipidemia Mother   . Hypertension Mother   . Kidney failure Father   . Stroke Father      Current Outpatient Medications:  .  amLODipine (NORVASC) 5 MG tablet, Take 5 mg by mouth daily., Disp: , Rfl:  .  Cholecalciferol (VITAMIN D3) 5000 UNITS CAPS, Take 5,000 Units by mouth every other day. , Disp: , Rfl:  .  ferrous sulfate 325 (65 FE) MG tablet, Take 1 tablet (325 mg total) by mouth daily with breakfast., Disp: 30 tablet, Rfl: 3 .  glucose blood (ONETOUCH VERIO) test strip, 1 each by Other route  daily. Use as instructed, Disp: , Rfl:  .  hydrALAZINE (APRESOLINE) 25 MG tablet, Take 25 mg by mouth daily., Disp: , Rfl:  .  metoprolol tartrate (LOPRESSOR) 25 MG tablet, Take 25 mg by mouth daily., Disp: , Rfl:  .  rosuvastatin (CRESTOR) 10 MG tablet, Take 1 tablet (10 mg total) by mouth daily., Disp: 90 tablet, Rfl: 1 .  olmesartan-hydrochlorothiazide (BENICAR HCT) 40-25 MG tablet, Take 1 tablet by mouth daily., Disp: , Rfl:    Allergies  Allergen Reactions  . Doxycycline Rash     Review of Systems  Constitutional: Negative.   Eyes: Negative for blurred vision.  Respiratory: Positive for shortness of breath (she is happy to state she quit smoking 5 weeks ago. However, she has developed SOB/DOE since then. She reports she was SOB when she had to walk a block. She does not get SOB when walking up steps in her home. Only has issues when outside.).   Cardiovascular: Negative.  Negative for chest pain.  Gastrointestinal: Negative.   Neurological: Negative.   Psychiatric/Behavioral: Negative.      Today's Vitals   06/05/19 1513  BP: 122/86  Pulse: 81  Temp: 98.9 F (37.2 C)  TempSrc: Oral  Weight: 158 lb 9.6 oz (71.9 kg)  Height: 4' 10"  (1.473 m)   Body mass index is 33.15 kg/m.   Objective:  Physical Exam Vitals signs and nursing note reviewed.  Constitutional:      Appearance:  Normal appearance.  HENT:     Head: Normocephalic and atraumatic.  Cardiovascular:     Rate and Rhythm: Normal rate and regular rhythm.     Pulses:          Dorsalis pedis pulses are 2+ on the right side and 2+ on the left side.     Heart sounds: Normal heart sounds.  Pulmonary:     Effort: Pulmonary effort is normal.     Breath sounds: Normal breath sounds.  Musculoskeletal:     Right lower leg: 1+ Pitting Edema present.     Left lower leg: 1+ Pitting Edema present.  Feet:     Right foot:     Protective Sensation: 5 sites tested. 5 sites sensed.     Skin integrity: Skin integrity normal.      Toenail Condition: Right toenails are long.     Left foot:     Protective Sensation: 5 sites tested. 5 sites sensed.     Skin integrity: Skin integrity normal.     Toenail Condition: Left toenails are long.  Skin:    General: Skin is warm.  Neurological:     General: No focal deficit present.     Mental Status: She is alert.  Psychiatric:        Mood and Affect: Mood normal.        Behavior: Behavior normal.         Assessment And Plan:     1. Hypertensive nephropathy  Chronic, well controlled. She will continue with current meds.   2. Type 2 diabetes mellitus with stage 2 chronic kidney disease, without long-term current use of insulin ([place])  Diabetic foot exam was performed.  I will check labs as listed below. Importance of dietary compliance was discussed with the patient.   - Hemoglobin A1c - BMP8+EGFR  3. Lung nodule  She has been evaluated by pulmonary. She is aware that she will need repeat CT in Oct 2020 for further surveillance of the thyroid nodule.   4. Cigarette nicotine dependence without complication  Low dose CT scan results were reviewed in full detail during her visit.  She understands she will need repeat test in  one year.   5. Lower extremity edema  She is encouraged to elevate her feet while seated and decrease her intake of processed meats.   Anita Greenland, MD    THE PATIENT IS ENCOURAGED TO PRACTICE SOCIAL DISTANCING DUE TO THE COVID-19 PANDEMIC.

## 2019-06-06 LAB — HEMOGLOBIN A1C
Est. average glucose Bld gHb Est-mCnc: 134 mg/dL
Hgb A1c MFr Bld: 6.3 % — ABNORMAL HIGH (ref 4.8–5.6)

## 2019-06-06 LAB — BMP8+EGFR
BUN/Creatinine Ratio: 16 (ref 12–28)
BUN: 16 mg/dL (ref 8–27)
CO2: 21 mmol/L (ref 20–29)
Calcium: 9.8 mg/dL (ref 8.7–10.3)
Chloride: 106 mmol/L (ref 96–106)
Creatinine, Ser: 0.99 mg/dL (ref 0.57–1.00)
GFR calc Af Amer: 66 mL/min/{1.73_m2} (ref >59)
GFR calc non Af Amer: 58 mL/min/{1.73_m2} — ABNORMAL LOW (ref >59)
Glucose: 97 mg/dL (ref 65–99)
Potassium: 4.2 mmol/L (ref 3.5–5.2)
Sodium: 144 mmol/L (ref 134–144)

## 2019-06-14 ENCOUNTER — Ambulatory Visit
Admission: RE | Admit: 2019-06-14 | Discharge: 2019-06-14 | Disposition: A | Discharge status: Home / Self Care | Payer: Medicare Other | Source: Ambulatory Visit | Attending: Internal Medicine | Admitting: Internal Medicine

## 2019-06-14 DIAGNOSIS — E0789 Other specified disorders of thyroid: Secondary | ICD-10-CM

## 2019-06-14 DIAGNOSIS — E079 Disorder of thyroid, unspecified: Secondary | ICD-10-CM

## 2019-06-17 ENCOUNTER — Other Ambulatory Visit: Payer: Self-pay

## 2019-06-17 DIAGNOSIS — E041 Nontoxic single thyroid nodule: Secondary | ICD-10-CM

## 2019-06-20 ENCOUNTER — Other Ambulatory Visit: Payer: Self-pay | Admitting: Emergency Medicine

## 2019-06-24 ENCOUNTER — Other Ambulatory Visit: Payer: Self-pay

## 2019-06-24 MED ORDER — ROSUVASTATIN CALCIUM 10 MG PO TABS: 10.0000 mg | ORAL_TABLET | Freq: Every day | ORAL | 0 refills | Status: DC

## 2019-06-24 MED ORDER — AMLODIPINE BESYLATE 5 MG PO TABS: 5.0000 mg | ORAL_TABLET | Freq: Every day | ORAL | 0 refills | Status: DC

## 2019-06-24 MED ORDER — HYDRALAZINE HCL 25 MG PO TABS: 25.0000 mg | ORAL_TABLET | Freq: Every day | ORAL | 0 refills | Status: DC

## 2019-06-24 MED ORDER — METOPROLOL TARTRATE 25 MG PO TABS: 25.0000 mg | ORAL_TABLET | Freq: Every day | ORAL | 0 refills | Status: DC

## 2019-06-28 ENCOUNTER — Other Ambulatory Visit (HOSPITAL_COMMUNITY)
Admission: RE | Admit: 2019-06-28 | Discharge: 2019-06-28 | Disposition: A | Discharge status: Home / Self Care | Payer: Medicare Other | Source: Ambulatory Visit | Attending: Emergency Medicine | Admitting: Emergency Medicine

## 2019-06-28 DIAGNOSIS — Z01812 Encounter for preprocedural laboratory examination: Secondary | ICD-10-CM | POA: Insufficient documentation

## 2019-06-28 DIAGNOSIS — Z20828 Contact with and (suspected) exposure to other viral communicable diseases: Secondary | ICD-10-CM | POA: Diagnosis not present

## 2019-06-28 LAB — SARS CORONAVIRUS 2 (TAT 6-24 HRS): SARS Coronavirus 2: NEGATIVE

## 2019-07-02 ENCOUNTER — Telehealth: Payer: Self-pay | Admitting: *Deleted

## 2019-07-02 NOTE — Telephone Encounter (Signed)
Called patient to remind of PFT She is not on any inhalers. Covid negative Pt wanted know about caffeine use. She will be able to have her am coffee. PFT is at 2 pm. Nothing further needed.

## 2019-07-03 ENCOUNTER — Other Ambulatory Visit: Payer: Self-pay

## 2019-07-03 ENCOUNTER — Ambulatory Visit: Payer: Medicare Other | Admitting: Emergency Medicine

## 2019-07-03 ENCOUNTER — Ambulatory Visit (INDEPENDENT_AMBULATORY_CARE_PROVIDER_SITE_OTHER): Payer: Medicare Other | Admitting: Emergency Medicine

## 2019-07-03 ENCOUNTER — Encounter: Payer: Self-pay | Admitting: Emergency Medicine

## 2019-07-03 DIAGNOSIS — R0602 Shortness of breath: Secondary | ICD-10-CM

## 2019-07-03 DIAGNOSIS — R0600 Dyspnea, unspecified: Secondary | ICD-10-CM

## 2019-07-03 DIAGNOSIS — Z23 Encounter for immunization: Secondary | ICD-10-CM

## 2019-07-03 LAB — PULMONARY FUNCTION TEST
DL/VA % pred: 132 %
DL/VA: 5.73 ml/min/mmHg/L
DLCO unc % pred: 103 %
DLCO unc: 16.15 ml/min/mmHg
FEF 25-75 Post: 2.3 L/sec
FEF 25-75 Pre: 1.87 L/sec
FEF2575-%Change-Post: 23 %
FEF2575-%Pred-Post: 183 %
FEF2575-%Pred-Pre: 149 %
FEV1-%Change-Post: 4 %
FEV1-%Pred-Post: 112 %
FEV1-%Pred-Pre: 108 %
FEV1-Post: 1.47 L
FEV1-Pre: 1.4 L
FEV1FVC-%Change-Post: 0 %
FEV1FVC-%Pred-Pre: 112 %
FEV6-%Change-Post: 3 %
FEV6-%Pred-Post: 105 %
FEV6-%Pred-Pre: 101 %
FEV6-Post: 1.69 L
FEV6-Pre: 1.62 L
FEV6FVC-%Pred-Post: 105 %
FEV6FVC-%Pred-Pre: 105 %
FVC-%Change-Post: 3 %
FVC-%Pred-Post: 99 %
FVC-%Pred-Pre: 95 %
FVC-Post: 1.69 L
FVC-Pre: 1.62 L
Post FEV1/FVC ratio: 87 %
Post FEV6/FVC ratio: 100 %
Pre FEV1/FVC ratio: 86 %
Pre FEV6/FVC Ratio: 100 %
RV % pred: 85 %
RV: 1.62 L
TLC % pred: 79 %
TLC: 3.32 L

## 2019-07-03 NOTE — Progress Notes (Signed)
Subjective:    Patient ID: Anita Carpenter, female    DOB: 1946-07-25, 73 y.o.   MRN: [id number]  HPI 73 year old former smoker (4-5 pack years) with a history of hypertension, diabetes, hyperlipidemia, GERD.  She carries a history of childhood asthma.  She is referred today for evaluation of shortness of breath.  She began to notice exertional chest tightness, dyspnea, some discomfort into her neck when walking an incline, carrying packages. Better with rest for a few minutes. She hasn't had these sx recently, but notes that her exercise has decreased significantly since COVID isolation. No cough, sputum, no wheeze. She has rare GERD sx, not really active unless she eats late or spicy foods.   Nuclear cardiac stress test 01/24/2014 >> low risk study, normal LV fxn  She is scheduled for LDCT screening, but she doesn't qualify based on pack-yrs above.   ROV 07/03/2019 --this is a follow-up visit for evaluation of exertional dyspnea.  She underwent pulmonary function testing today that I have reviewed.  This shows principally restriction based on a slightly decreased total lung capacity. Her spirometry is grossly normal without a bronchodilator response.  Her diffusion capacity is normal as well. She has not been very active - hasn't really pushed her self much since last time.    Review of Systems  Constitutional: Negative for fever and unexpected weight change.  HENT: Negative for congestion, dental problem, ear pain, nosebleeds, postnasal drip, rhinorrhea, sinus pressure, sneezing, sore throat and trouble swallowing.   Eyes: Negative for redness and itching.  Respiratory: Positive for chest tightness and shortness of breath. Negative for cough and wheezing.   Cardiovascular: Positive for palpitations. Negative for leg swelling.  Gastrointestinal: Negative for nausea and vomiting.  Genitourinary: Negative for dysuria.  Musculoskeletal: Negative for joint swelling.  Skin: Negative for rash.   Neurological: Negative for headaches.  Hematological: Does not bruise/bleed easily.  Psychiatric/Behavioral: Negative for dysphoric mood. The patient is not nervous/anxious.     Past Medical History:  Diagnosis Date  . Asthma   . Diabetes mellitus without complication ([facility])   . High cholesterol   . Hypertension      Family History  Problem Relation Age of Onset  . Asthma Other   . Diabetes Other   . Hyperlipidemia Other   . Hyperlipidemia Mother   . Hypertension Mother   . Kidney failure Father   . Stroke Father      Social History   Socioeconomic History  . Marital status: Divorced    Spouse name: Not on file  . Number of children: Not on file  . Years of education: Not on file  . Highest education level: Not on file  Occupational History  . Occupation: retired  Engineer, production  . Financial resource strain: Not hard at all  . Food insecurity    Worry: Never true    Inability: Never true  . Transportation needs    Medical: No    Non-medical: No  Tobacco Use  . Smoking status: Former Smoker    Packs/day: 0.25    Years: 20.00    Pack years: 5.00    Types: Cigarettes    Quit date: 12/03/2018    Years since quitting: 0.5  . Smokeless tobacco: Never Used  . Tobacco comment: 1ppdx  Substance and Sexual Activity  . Alcohol use: Not Currently  . Drug use: Never  . Sexual activity: Not Currently  Lifestyle  . Physical activity    Days per week: 0  days    Minutes per session: 0 min  . Stress: Not at all  Relationships  . Social Musicianconnections    Talks on phone: Not on file    Gets together: Not on file    Attends religious service: Not on file    Active member of club or organization: Not on file    Attends meetings of clubs or organizations: Not on file    Relationship status: Not on file  . Intimate partner violence    Fear of current or ex partner: No    Emotionally abused: No    Physically abused: No    Forced sexual activity: No  Other Topics Concern  .  Not on file  Social History Narrative  . 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 Whether she may have been exposed to asbestos through work.  [place] native   Allergies  Allergen Reactions  . Doxycycline Rash     Outpatient Medications Prior to Visit  Medication Sig Dispense Refill  . amLODipine (NORVASC) 5 MG tablet Take 1 tablet (5 mg total) by mouth daily. 90 tablet 0  . Cholecalciferol (VITAMIN D3) 5000 UNITS CAPS Take 5,000 Units by mouth every other day.     . ferrous sulfate 325 (65 FE) MG tablet Take 1 tablet (325 mg total) by mouth daily with breakfast. 30 tablet 3  . glucose blood (ONETOUCH VERIO) test strip 1 each by Other route daily. Use as instructed    . hydrALAZINE (APRESOLINE) 25 MG tablet Take 1 tablet (25 mg total) by mouth daily. 90 tablet 0  . metoprolol tartrate (LOPRESSOR) 25 MG tablet Take 1 tablet (25 mg total) by mouth daily. 90 tablet 0  . olmesartan-hydrochlorothiazide (BENICAR HCT) 40-25 MG tablet Take 1 tablet by mouth daily.    . rosuvastatin (CRESTOR) 10 MG tablet Take 1 tablet (10 mg total) by mouth daily. 90 tablet 0   No facility-administered medications prior to visit.          Objective:   Physical Exam Vitals:   07/03/19 1509  BP: 132/90  Pulse: 74  SpO2: 98%  Weight: 157 lb (71.2 kg)  Height: 4' 9.5" (1.461 m)   Gen: Pleasant, overwt, in no distress,  normal affect  ENT: No lesions,  mouth clear,  oropharynx clear, no postnasal drip  Neck: No JVD, no stridor  Lungs: No use of accessory muscles, no crackles or wheezing on normal respiration, no wheeze on forced expiration  Cardiovascular: RRR, heart sounds normal, no murmur or gallops, no peripheral edema  Musculoskeletal: No deformities, no cyanosis or clubbing  Neuro: alert, awake, non focal  Skin: Warm, no lesions or rash     Assessment & Plan:  Dyspnea Based on her reassuring pulmonary function testing, mild restriction I suspect that she has  evolved some deconditioning and dyspnea from weight gain.  I do not see any evidence for obstruction.  She is interested in increasing her activity, exercise routine.  Have supported this.  If she starts to exercise, lose weight but remains dyspneic then I think we may need to revisit any potential cardiac component.  We may decide to do a cardiopulmonary stress test or refer her to cardiology for repeat Myoview.  Your breathing tests today are reassuring, without any evidence for COPD or residual asthma Agree with your plans to start slowly increasing your exercise. This should help your conditioning and your breathing We will not start any inhaler medications  at this time.  Please follow-up if you continue to have shortness of breath after several months.  If so we will help facilitate more testing if indicated.  Baltazar Apo, MD, PhD 07/03/2019, 3:45 PM [facility] [phone] or if no answer [phone]

## 2019-07-03 NOTE — Patient Instructions (Addendum)
Your breathing tests today are reassuring, without any evidence for COPD or residual asthma Agree with your plans to start slowly increasing your exercise. This should help your conditioning and your breathing We will not start any inhaler medications at this time.  Please follow-up if you continue to have shortness of breath after several months.  If so we will help facilitate more testing if indicated.

## 2019-07-03 NOTE — Assessment & Plan Note (Signed)
Based on her reassuring pulmonary function testing, mild restriction I suspect that she has evolved some deconditioning and dyspnea from weight gain.  I do not see any evidence for obstruction.  She is interested in increasing her activity, exercise routine.  Have supported this.  If she starts to exercise, lose weight but remains dyspneic then I think we may need to revisit any potential cardiac component.  We may decide to do a cardiopulmonary stress test or refer her to cardiology for repeat Myoview.  Your breathing tests today are reassuring, without any evidence for COPD or residual asthma Agree with your plans to start slowly increasing your exercise. This should help your conditioning and your breathing We will not start any inhaler medications at this time.  Please follow-up if you continue to have shortness of breath after several months.  If so we will help facilitate more testing if indicated.

## 2019-07-03 NOTE — Progress Notes (Signed)
Full PFT performed today. °

## 2019-07-12 ENCOUNTER — Telehealth: Payer: Self-pay | Admitting: Emergency Medicine

## 2019-07-12 NOTE — Telephone Encounter (Signed)
Spoke with pt. Advised her that she was given the "senior" dose flu shot. Nothing further was needed at this time.

## 2019-07-29 LAB — HM DIABETES EYE EXAM

## 2019-07-30 ENCOUNTER — Encounter: Payer: Self-pay | Admitting: Internal Medicine

## 2019-08-14 ENCOUNTER — Other Ambulatory Visit (HOSPITAL_COMMUNITY)
Admission: RE | Admit: 2019-08-14 | Discharge: 2019-08-14 | Disposition: A | Discharge status: Home / Self Care | Payer: Medicare Other | Source: Ambulatory Visit | Attending: Physician Assistant | Admitting: Physician Assistant

## 2019-08-14 ENCOUNTER — Ambulatory Visit
Admission: RE | Admit: 2019-08-14 | Discharge: 2019-08-14 | Disposition: A | Discharge status: Home / Self Care | Payer: Medicare Other | Source: Ambulatory Visit | Attending: Internal Medicine | Admitting: Internal Medicine

## 2019-08-14 DIAGNOSIS — E041 Nontoxic single thyroid nodule: Secondary | ICD-10-CM

## 2019-08-14 NOTE — Procedures (Signed)
PROCEDURE SUMMARY:  Using direct ultrasound guidance, 5 passes were made using 25 g needles into the nodule within the right lobe of the thyroid.   Ultrasound was used to confirm needle placements on all occasions.   EBL = trace  Specimens were sent to Pathology for analysis.  See procedure note under Imaging tab in Epic for full procedure details.  Aleasha Fregeau S Shamariah Shewmake PA-C 08/14/2019 4:09 PM

## 2019-08-15 LAB — CYTOLOGY - NON PAP

## 2019-08-30 LAB — HM MAMMOGRAPHY

## 2019-09-04 ENCOUNTER — Encounter: Payer: Self-pay | Admitting: Internal Medicine

## 2019-09-12 ENCOUNTER — Ambulatory Visit: Payer: Medicare Other | Admitting: Internal Medicine

## 2019-09-17 ENCOUNTER — Other Ambulatory Visit: Payer: Self-pay

## 2019-09-17 MED ORDER — ROSUVASTATIN CALCIUM 10 MG PO TABS: 10.0000 mg | ORAL_TABLET | Freq: Every day | ORAL | 0 refills | Status: DC

## 2019-09-17 MED ORDER — HYDRALAZINE HCL 25 MG PO TABS: 25.0000 mg | ORAL_TABLET | Freq: Every day | ORAL | 0 refills | Status: DC

## 2019-09-17 MED ORDER — AMLODIPINE BESYLATE 5 MG PO TABS: 5.0000 mg | ORAL_TABLET | Freq: Every day | ORAL | 0 refills | Status: DC

## 2019-09-25 ENCOUNTER — Ambulatory Visit (INDEPENDENT_AMBULATORY_CARE_PROVIDER_SITE_OTHER): Payer: Medicare Other | Admitting: Internal Medicine

## 2019-09-25 ENCOUNTER — Other Ambulatory Visit: Payer: Self-pay

## 2019-09-25 ENCOUNTER — Encounter: Payer: Self-pay | Admitting: Internal Medicine

## 2019-09-25 VITALS — BP 140/86 | HR 92 | Temp 98.9°F | Ht <= 58 in | Wt 158.0 lb

## 2019-09-25 DIAGNOSIS — I129 Hypertensive chronic kidney disease with stage 1 through stage 4 chronic kidney disease, or unspecified chronic kidney disease: Secondary | ICD-10-CM | POA: Diagnosis not present

## 2019-09-25 DIAGNOSIS — H9193 Unspecified hearing loss, bilateral: Secondary | ICD-10-CM | POA: Diagnosis not present

## 2019-09-25 DIAGNOSIS — N182 Chronic kidney disease, stage 2 (mild): Secondary | ICD-10-CM

## 2019-09-25 DIAGNOSIS — R0602 Shortness of breath: Secondary | ICD-10-CM

## 2019-09-25 DIAGNOSIS — E1122 Type 2 diabetes mellitus with diabetic chronic kidney disease: Secondary | ICD-10-CM

## 2019-09-25 DIAGNOSIS — Z87891 Personal history of nicotine dependence: Secondary | ICD-10-CM

## 2019-09-25 DIAGNOSIS — E6609 Other obesity due to excess calories: Secondary | ICD-10-CM

## 2019-09-25 DIAGNOSIS — Z6833 Body mass index (BMI) 33.0-33.9, adult: Secondary | ICD-10-CM

## 2019-09-25 NOTE — Patient Instructions (Signed)
Please bring in BP machine at your next visit.   Please keep BP log - bring in to your next appointment.   Please notify Dr. Baird Cancer if your readings are above 140/80.   Cut back on your salt intake.    MOVE MORE!!  March in place during commercials.

## 2019-09-26 LAB — HEMOGLOBIN A1C
Est. average glucose Bld gHb Est-mCnc: 140 mg/dL
Hgb A1c MFr Bld: 6.5 % — ABNORMAL HIGH (ref 4.8–5.6)

## 2019-09-26 LAB — BMP8+EGFR
BUN/Creatinine Ratio: 13 (ref 12–28)
BUN: 11 mg/dL (ref 8–27)
CO2: 24 mmol/L (ref 20–29)
Calcium: 10 mg/dL (ref 8.7–10.3)
Chloride: 103 mmol/L (ref 96–106)
Creatinine, Ser: 0.88 mg/dL (ref 0.57–1.00)
GFR calc Af Amer: 76 mL/min/{1.73_m2} (ref >59)
GFR calc non Af Amer: 66 mL/min/{1.73_m2} (ref >59)
Glucose: 93 mg/dL (ref 65–99)
Potassium: 3.9 mmol/L (ref 3.5–5.2)
Sodium: 140 mmol/L (ref 134–144)

## 2019-10-04 ENCOUNTER — Encounter: Payer: Self-pay | Admitting: Internal Medicine

## 2019-10-04 NOTE — Progress Notes (Addendum)
s Subjective:     Patient ID: Anita Carpenter , female    DOB: [birth year] , 73 y.o.   MRN: [id number]   Chief Complaint  Patient presents with  . Hypertension  . Diabetes    HPI  Hypertension This is a chronic problem. The current episode started more than 1 year ago. The problem has been gradually improving since onset. The problem is controlled. Risk factors for coronary artery disease include post-menopausal state. The current treatment provides moderate improvement.  Diabetes She presents for her follow-up diabetic visit. She has type 2 diabetes mellitus. There are no hypoglycemic associated symptoms. There are no diabetic associated symptoms. There are no hypoglycemic complications. Risk factors for coronary artery disease include diabetes mellitus, hypertension, sedentary lifestyle and post-menopausal.     Past Medical History:  Diagnosis Date  . Asthma   . Diabetes mellitus without complication ([place])   . High cholesterol   . Hypertension      Family History  Problem Relation Age of Onset  . Asthma Other   . Diabetes Other   . Hyperlipidemia Other   . Hyperlipidemia Mother   . Hypertension Mother   . Kidney failure Father   . Stroke Father      Current Outpatient Medications:  .  amLODipine (NORVASC) 5 MG tablet, Take 1 tablet (5 mg total) by mouth daily., Disp: 90 tablet, Rfl: 0 .  Cholecalciferol (VITAMIN D3) 5000 UNITS CAPS, Take 5,000 Units by mouth every other day. , Disp: , Rfl:  .  ferrous sulfate 325 (65 FE) MG tablet, Take 1 tablet (325 mg total) by mouth daily with breakfast., Disp: 30 tablet, Rfl: 3 .  glucose blood (ONETOUCH VERIO) test strip, 1 each by Other route daily. Use as instructed, Disp: , Rfl:  .  hydrALAZINE (APRESOLINE) 25 MG tablet, Take 1 tablet (25 mg total) by mouth daily., Disp: 90 tablet, Rfl: 0 .  metoprolol tartrate (LOPRESSOR) 25 MG tablet, Take 1 tablet (25 mg total) by mouth daily., Disp: 90 tablet, Rfl: 0 .   olmesartan-hydrochlorothiazide (BENICAR HCT) 40-25 MG tablet, Take 1 tablet by mouth daily., Disp: , Rfl:  .  rosuvastatin (CRESTOR) 10 MG tablet, Take 1 tablet (10 mg total) by mouth daily., Disp: 90 tablet, Rfl: 0   Allergies  Allergen Reactions  . Doxycycline Rash       Review of Systems  Constitutional: Negative.   Respiratory: Negative.        She still has SOB on occasion. Has been seen by Pulm. She states no abnormalities found.   Cardiovascular: Negative.   Gastrointestinal: Negative.   Neurological: Negative.   Psychiatric/Behavioral: Negative.     Today's Vitals   09/25/19 1620  BP: 140/86  Pulse: 92  Temp: 98.9 F (37.2 C)  Weight: 158 lb (71.7 kg)  Height: 4' 9.5" (1.461 m)  PainSc: 0-No pain   Body mass index is 33.6 kg/m.   Objective:  Physical Exam Vitals and nursing note reviewed.  Constitutional:      Appearance: Normal appearance. She is obese.  HENT:     Head: Normocephalic and atraumatic.     Right Ear: Tympanic membrane, ear canal and external ear normal.     Left Ear: Tympanic membrane, ear canal and external ear normal.  Cardiovascular:     Rate and Rhythm: Normal rate and regular rhythm.     Heart sounds: Normal heart sounds.  Pulmonary:     Effort: Pulmonary effort is normal.  Breath sounds: Normal breath sounds.  Skin:    General: Skin is warm.  Neurological:     General: No focal deficit present.     Mental Status: She is alert.  Psychiatric:        Mood and Affect: Mood normal.        Behavior: Behavior normal.         Assessment And Plan:     1. Hypertensive nephropathy  Chronic, fair control.  She will continue with current meds. She is encouraged to avoid adding salt to her foods. Importance of regular exercise was also discussed with the patient.   2. Type 2 diabetes mellitus with stage 2 chronic kidney disease, without long-term current use of insulin ([place])  I will check labs as listed below. She is encouraged to  check her feet regularly.   - Hemoglobin A1c - BMP8+EGFR  3. Hearing deficit, bilateral  Audiometry screening performed today. I will refer her to ENT/Audiometry for further evaluation.    4. Class 1 obesity due to excess calories with serious comorbidity and body mass index (BMI) of 33.0 to 33.9 in adult  Importance of achieving optimal weight to decrease risk of cardiovascular disease and cancers was discussed with the patient in full detail. She is encouraged to start slowly - start with 10 minutes twice daily at least three to four days per week and to gradually build to 30 minutes five days weekly. She was given tips to incorporate more activity into her daily routine - take stairs when possible, park farther away from grocery stores, etc.   5. SOB (shortness of breath)  She has had pulmonary evaluation. It is felt to be due to deconditioning. She is encouraged to gradually increase her daily activity as tolerated.    [name], MD    THE PATIENT IS ENCOURAGED TO PRACTICE SOCIAL DISTANCING DUE TO THE COVID-19 PANDEMIC.

## 2019-10-07 NOTE — Addendum Note (Signed)
Addended by: [name] on: 10/07/2019 10:44 AM   Modules accepted: Orders

## 2019-10-29 ENCOUNTER — Ambulatory Visit (INDEPENDENT_AMBULATORY_CARE_PROVIDER_SITE_OTHER): Payer: Medicare Other | Admitting: Otolaryngology

## 2019-10-29 ENCOUNTER — Other Ambulatory Visit: Payer: Self-pay

## 2019-10-29 ENCOUNTER — Encounter (INDEPENDENT_AMBULATORY_CARE_PROVIDER_SITE_OTHER): Payer: Self-pay | Admitting: Otolaryngology

## 2019-10-29 VITALS — Temp 98.4°F

## 2019-10-29 DIAGNOSIS — H6123 Impacted cerumen, bilateral: Secondary | ICD-10-CM | POA: Diagnosis not present

## 2019-10-29 DIAGNOSIS — H903 Sensorineural hearing loss, bilateral: Secondary | ICD-10-CM

## 2019-10-29 NOTE — Progress Notes (Signed)
HPI: Anita Carpenter is a 73 y.o. female who presents for evaluation of hearing evaluation.  She states that she is having difficulty hearing her great-grandchildren well.  She used to have her ears washed out as she had a tendency to buildup wax but has not had this in a while.  She has tried Debrox. Denies any pain or drainage from her ears.  Past Medical History:  Diagnosis Date  . Asthma   . Diabetes mellitus without complication (HCC)   . High cholesterol   . Hypertension    Past Surgical History:  Procedure Laterality Date  . TONSILLECTOMY     Social History   Socioeconomic History  . Marital status: Divorced    Spouse name: Not on file  . Number of children: Not on file  . Years of education: Not on file  . Highest education level: Not on file  Occupational History  . Occupation: retired  Tobacco Use  . Smoking status: Former Smoker    Packs/day: 0.25    Years: 50.00    Pack years: 12.50    Types: Cigarettes    Start date: 82    Quit date: 12/03/2018    Years since quitting: 0.9  . Smokeless tobacco: Never Used  . Tobacco comment: 1ppdx  Substance and Sexual Activity  . Alcohol use: Not Currently  . Drug use: Never  . Sexual activity: Not Currently  Other Topics Concern  . Not on file  Social History Narrative  . Not on file   Social Determinants of Health   Financial Resource Strain: Low Risk   . Difficulty of Paying Living Expenses: Not hard at all  Food Insecurity: No Food Insecurity  . Worried About Programme researcher, broadcasting/film/video in the Last Year: Never true  . Ran Out of Food in the Last Year: Never true  Transportation Needs: No Transportation Needs  . Lack of Transportation (Medical): No  . Lack of Transportation (Non-Medical): No  Physical Activity: Inactive  . Days of Exercise per Week: 0 days  . Minutes of Exercise per Session: 0 min  Stress: No Stress Concern Present  . Feeling of Stress : Not at all  Social Connections:   . Frequency of  Communication with Friends and Family: Not on file  . Frequency of Social Gatherings with Friends and Family: Not on file  . Attends Religious Services: Not on file  . Active Member of Clubs or Organizations: Not on file  . Attends Banker Meetings: Not on file  . Marital Status: Not on file   Family History  Problem Relation Age of Onset  . Asthma Other   . Diabetes Other   . Hyperlipidemia Other   . Hyperlipidemia Mother   . Hypertension Mother   . Kidney failure Father   . Stroke Father    Allergies  Allergen Reactions  . Doxycycline Rash   Prior to Admission medications   Medication Sig Start Date End Date Taking? Authorizing Provider  amLODipine (NORVASC) 5 MG tablet Take 1 tablet (5 mg total) by mouth daily. 09/17/19  Yes Dorothyann Peng, MD  Cholecalciferol (VITAMIN D3) 5000 UNITS CAPS Take 5,000 Units by mouth every other day.    Yes [provider]  ferrous sulfate 325 (65 FE) MG tablet Take 1 tablet (325 mg total) by mouth daily with breakfast. 01/25/14  Yes Regalado, Belkys A, MD  glucose blood (ONETOUCH VERIO) test strip 1 each by Other route daily. Use as instructed  Yes [provider]  hydrALAZINE (APRESOLINE) 25 MG tablet Take 1 tablet (25 mg total) by mouth daily. 09/17/19  Yes Glendale Chard, MD  metoprolol tartrate (LOPRESSOR) 25 MG tablet Take 1 tablet (25 mg total) by mouth daily. 06/24/19  Yes Glendale Chard, MD  olmesartan-hydrochlorothiazide (BENICAR HCT) 40-25 MG tablet Take 1 tablet by mouth daily. 06/19/18  Yes [provider]  rosuvastatin (CRESTOR) 10 MG tablet Take 1 tablet (10 mg total) by mouth daily. 09/17/19  Yes Glendale Chard, MD     Positive ROS: Otherwise negative  All other systems have been reviewed and were otherwise negative with the exception of those mentioned in the HPI and as above.  Physical Exam: Constitutional: Alert, well-appearing, no acute distress Ears: External ears without lesions or  tenderness.  She had moderate wax buildup in her ears that was cleaned with curettes.  TMs were otherwise clear.  Tuning forks demonstrated mild hearing loss in both ears.  AC > BC bilaterally. Nasal: External nose without lesions. Septum relatively midline.  Mild rhinitis with mild swelling of nasal mucosal.. Clear nasal passages.  No signs of infection. Oral: Lips and gums without lesions. Tongue and palate mucosa without lesions. Posterior oropharynx clear. Neck: No palpable adenopathy or masses Respiratory: Breathing comfortably  Skin: No facial/neck lesions or rash noted.  Cerumen impaction removal  Date/Time: 10/29/2019 1:40 PM Performed by: Rozetta Nunnery, MD Authorized by: Rozetta Nunnery, MD   Consent:    Consent obtained:  Verbal   Consent given by:  Patient   Risks discussed:  Pain and bleeding Procedure details:    Location:  L ear and R ear   Procedure type: curette   Post-procedure details:    Inspection:  TM intact and canal normal   Hearing quality:  Improved   Patient tolerance of procedure:  Tolerated well, no immediate complications Comments:     Minimal wax buildup in ear canals.  Audiogram demonstrated a mild hearing loss in both ears slightly worse on the right side with pure tones ranging between 25 and 40 DB.  SRT's were 30 dB on the right and 25 dB on the left.  Type a tympanograms bilaterally.  Assessment: Cerumen buildup Mild symmetric SNHL  Plan: She would be a candidate for hearing aids and discussed this with her.  Radene Journey, MD

## 2019-10-30 ENCOUNTER — Encounter (INDEPENDENT_AMBULATORY_CARE_PROVIDER_SITE_OTHER): Payer: Self-pay

## 2019-12-25 ENCOUNTER — Other Ambulatory Visit: Payer: Self-pay

## 2019-12-25 MED ORDER — HYDRALAZINE HCL 25 MG PO TABS: 25.0000 mg | ORAL_TABLET | Freq: Every day | ORAL | 1 refills | Status: DC

## 2019-12-25 MED ORDER — AMLODIPINE BESYLATE 5 MG PO TABS: 5.0000 mg | ORAL_TABLET | Freq: Every day | ORAL | 1 refills | Status: DC

## 2019-12-25 MED ORDER — ROSUVASTATIN CALCIUM 10 MG PO TABS: 10.0000 mg | ORAL_TABLET | Freq: Every day | ORAL | 1 refills | Status: DC

## 2019-12-25 MED ORDER — METOPROLOL TARTRATE 25 MG PO TABS: 25.0000 mg | ORAL_TABLET | Freq: Every day | ORAL | 1 refills | Status: DC

## 2020-01-16 ENCOUNTER — Ambulatory Visit (INDEPENDENT_AMBULATORY_CARE_PROVIDER_SITE_OTHER): Payer: Medicare Other

## 2020-01-16 ENCOUNTER — Encounter: Payer: Medicare Other | Admitting: Internal Medicine

## 2020-01-16 VITALS — BP 162/92 | HR 92 | Temp 99.8°F | Ht <= 58 in | Wt 150.0 lb

## 2020-01-16 DIAGNOSIS — Z0000 Encounter for general adult medical examination without abnormal findings: Secondary | ICD-10-CM | POA: Diagnosis not present

## 2020-01-16 NOTE — Progress Notes (Signed)
This visit type was conducted due to national recommendations for restrictions regarding the COVID-19 Pandemic (e.g. social distancing). This format is felt to be most appropriate for this patient at this time. All issues noted in this document were discussed and addressed. No physical exam was performed (except for noted visual exam findings with Video Visits). This patient, [name], has given permission to perform this visit via telephone. Vital signs may be absent or patient reported.  Patient location:  At home  Nurse location:  Office    Subjective:   Anita Carpenter is a 74 y.o. female who presents for Medicare Annual (Subsequent) preventive examination.  Review of Systems:  n/a Cardiac Risk Factors include: advanced age (>40men, >6 women);diabetes mellitus;hypertension;obesity (BMI >30kg/m2);sedentary lifestyle     Objective:     Vitals: BP (!) 162/92 Comment: per patient  Pulse 92 Comment: per patient  Temp 99.8 F (37.7 C) Comment: per patient  Ht 4\' 10"  (1.473 m) Comment: per patient  Wt 150 lb (68 kg) Comment: per patient  BMI 31.35 kg/m   Body mass index is 31.35 kg/m.  Advanced Directives 01/16/2020 01/09/2019 06/10/2014  Does Patient Have a Medical Advance Directive? No No Patient does not have advance directive;Patient would not like information  Would patient like information on creating a medical advance directive? - Yes (MAU/Ambulatory/Procedural Areas - Information given) -    Tobacco Social History   Tobacco Use  Smoking Status Former Smoker  . Packs/day: 0.25  . Years: 50.00  . Pack years: 12.50  . Types: Cigarettes  . Start date: 35  . Quit date: 12/03/2018  . Years since quitting: 1.1  Smokeless Tobacco Never Used  Tobacco Comment   1ppdx     Counseling given: Not Answered Comment: 1ppdx   Clinical Intake:  Pre-visit preparation completed: Yes  Pain : No/denies pain     Nutritional Status: BMI > 30  Obese Nutritional Risks:  None Diabetes: Yes  How often do you need to have someone help you when you read instructions, pamphlets, or other written materials from your doctor or pharmacy?: 1 - Never What is the last grade level you completed in school?: 1 year college  Interpreter Needed?: No  Information entered by :: NAllen LPN  Past Medical History:  Diagnosis Date  . Asthma   . Diabetes mellitus without complication (HCC)   . High cholesterol   . Hypertension    Past Surgical History:  Procedure Laterality Date  . TONSILLECTOMY     Family History  Problem Relation Age of Onset  . Asthma Other   . Diabetes Other   . Hyperlipidemia Other   . Hyperlipidemia Mother   . Hypertension Mother   . Kidney failure Father   . Stroke Father    Social History   Socioeconomic History  . Marital status: Divorced    Spouse name: Not on file  . Number of children: Not on file  . Years of education: Not on file  . Highest education level: Not on file  Occupational History  . Occupation: retired  Tobacco Use  . Smoking status: Former Smoker    Packs/day: 0.25    Years: 50.00    Pack years: 12.50    Types: Cigarettes    Start date: 36    Quit date: 12/03/2018    Years since quitting: 1.1  . Smokeless tobacco: Never Used  . Tobacco comment: 1ppdx  Substance and Sexual Activity  . Alcohol use: Not Currently  .  Drug use: Never  . Sexual activity: Not Currently  Other Topics Concern  . Not on file  Social History Narrative  . Not on file   Social Determinants of Health   Financial Resource Strain: Low Risk   . Difficulty of Paying Living Expenses: Not hard at all  Food Insecurity: No Food Insecurity  . Worried About Programme researcher, broadcasting/film/video in the Last Year: Never true  . Ran Out of Food in the Last Year: Never true  Transportation Needs: No Transportation Needs  . Lack of Transportation (Medical): No  . Lack of Transportation (Non-Medical): No  Physical Activity: Inactive  . Days of Exercise  per Week: 0 days  . Minutes of Exercise per Session: 0 min  Stress: No Stress Concern Present  . Feeling of Stress : Not at all  Social Connections:   . Frequency of Communication with Friends and Family:   . Frequency of Social Gatherings with Friends and Family:   . Attends Religious Services:   . Active Member of Clubs or Organizations:   . Attends Banker Meetings:   Marland Kitchen Marital Status:     Outpatient Encounter Medications as of 01/16/2020  Medication Sig  . amLODipine (NORVASC) 5 MG tablet Take 1 tablet (5 mg total) by mouth daily.  . Cholecalciferol (VITAMIN D3) 5000 UNITS CAPS Take 5,000 Units by mouth every other day.   . ferrous sulfate 325 (65 FE) MG tablet Take 1 tablet (325 mg total) by mouth daily with breakfast.  . glucose blood (ONETOUCH VERIO) test strip 1 each by Other route daily. Use as instructed  . hydrALAZINE (APRESOLINE) 25 MG tablet Take 1 tablet (25 mg total) by mouth daily.  . metoprolol tartrate (LOPRESSOR) 25 MG tablet Take 1 tablet (25 mg total) by mouth daily.  . rosuvastatin (CRESTOR) 10 MG tablet Take 1 tablet (10 mg total) by mouth daily.  Marland Kitchen olmesartan-hydrochlorothiazide (BENICAR HCT) 40-25 MG tablet Take 1 tablet by mouth daily.   No facility-administered encounter medications on file as of 01/16/2020.    Activities of Daily Living In your present state of health, do you have any difficulty performing the following activities: 01/16/2020  Hearing? N  Vision? N  Difficulty concentrating or making decisions? N  Walking or climbing stairs? N  Dressing or bathing? N  Doing errands, shopping? N  Preparing Food and eating ? N  Using the Toilet? N  In the past six months, have you accidently leaked urine? Y  Comment wears a pad  Do you have problems with loss of bowel control? N  Managing your Medications? N  Managing your Finances? N  Housekeeping or managing your Housekeeping? N  Some recent data might be hidden    Patient Care  Team: Dorothyann Peng, MD as PCP - General (Internal Medicine)    Assessment:   This is a routine wellness examination for [place].  Exercise Activities and Dietary recommendations Current Exercise Habits: The patient does not participate in regular exercise at present  Goals    . Patient Stated (pt-stated)     Wants to be more active.    . Patient Stated     01/16/2020, wants to get more exercise       Fall Risk Fall Risk  01/16/2020 06/05/2019 01/09/2019 09/17/2018 06/10/2014  Falls in the past year? 0 0 0 0 No  Risk for fall due to : Medication side effect - Medication side effect - -  Follow up Falls evaluation completed;Education provided;Falls  prevention discussed - Education provided;Falls prevention discussed - -   Is the patient's home free of loose throw rugs in walkways, pet beds, electrical cords, etc?   yes      Grab bars in the bathroom? no      Handrails on the stairs?   n/a      Adequate lighting?   yes  Timed Get Up and Go performed: n/a  Depression Screen PHQ 2/9 Scores 01/16/2020 01/09/2019 11/08/2018 09/17/2018  PHQ - 2 Score 0 0 0 0  PHQ- 9 Score - 0 - -     Cognitive Function     6CIT Screen 01/16/2020 01/09/2019  What Year? 0 points 0 points  What month? 0 points 0 points  What time? 0 points 0 points  Count back from 20 0 points 0 points  Months in reverse 0 points 0 points  Repeat phrase 0 points 0 points  Total Score 0 0    Immunization History  Administered Date(s) Administered  . Fluad Quad(high Dose 65+) 07/03/2019  . Influenza-Unspecified 07/20/2018    Qualifies for Shingles Vaccine? yes  Screening Tests Health Maintenance  Topic Date Due  . HEMOGLOBIN A1C  03/24/2020  . FOOT EXAM  06/04/2020  . OPHTHALMOLOGY EXAM  07/28/2020  . PNA vac Low Risk Adult (2 of 2 - PPSV23) 07/28/2020  . MAMMOGRAM  08/29/2021  . TETANUS/TDAP  08/06/2023  . COLONOSCOPY  08/19/2027  . INFLUENZA VACCINE  Completed  . DEXA SCAN  Completed  . Hepatitis C  Screening  Completed    Cancer Screenings: Lung: Low Dose CT Chest recommended if Age 51-80 years, 30 pack-year currently smoking OR have quit w/in 15years. Patient does not qualify. Breast:  Up to date on Mammogram? Yes   Up to date of Bone Density/Dexa? Yes Colorectal: up to date  Additional Screenings: : Hepatitis C Screening: 05/05/2014     Plan:    Patient wants to get more exercise.   I have personally reviewed and noted the following in the patient's chart:   . Medical and social history . Use of alcohol, tobacco or illicit drugs  . Current medications and supplements . Functional ability and status . Nutritional status . Physical activity . Advanced directives . List of other physicians . Hospitalizations, surgeries, and ER visits in previous 12 months . Vitals . Screenings to include cognitive, depression, and falls . Referrals and appointments  In addition, I have reviewed and discussed with patient certain preventive protocols, quality metrics, and best practice recommendations. A written personalized care plan for preventive services as well as general preventive health recommendations were provided to patient.     Kellie Simmering, LPN  [id number]/05/[id number]

## 2020-01-16 NOTE — Patient Instructions (Signed)
Anita Carpenter , Thank you for taking time to come for your Medicare Wellness Visit. I appreciate your ongoing commitment to your health goals. Please review the following plan we discussed and let me know if I can assist you in the future.   Screening recommendations/referrals: Colonoscopy: 08/2017 Mammogram: 08/2019 Bone Density: 10/2016 Recommended yearly ophthalmology/optometry visit for glaucoma screening and checkup Recommended yearly dental visit for hygiene and checkup  Vaccinations: Influenza vaccine: 06/2019 Pneumococcal vaccine: 07/2019 Tdap vaccine: 07/2013 Shingles vaccine: discussed    Advanced directives: Advance directive discussed with you today.   Conditions/risks identified: obesity  Next appointment: 01/30/2020 at 11:00   Preventive Care 65 Years and Older, Female Preventive care refers to lifestyle choices and visits with your health care provider that can promote health and wellness. What does preventive care include?  A yearly physical exam. This is also called an annual well check.  Dental exams once or twice a year.  Routine eye exams. Ask your health care provider how often you should have your eyes checked.  Personal lifestyle choices, including:  Daily care of your teeth and gums.  Regular physical activity.  Eating a healthy diet.  Avoiding tobacco and drug use.  Limiting alcohol use.  Practicing safe sex.  Taking low-dose aspirin every day.  Taking vitamin and mineral supplements as recommended by your health care provider. What happens during an annual well check? The services and screenings done by your health care provider during your annual well check will depend on your age, overall health, lifestyle risk factors, and family history of disease. Counseling  Your health care provider may ask you questions about your:  Alcohol use.  Tobacco use.  Drug use.  Emotional well-being.  Home and relationship well-being.  Sexual  activity.  Eating habits.  History of falls.  Memory and ability to understand (cognition).  Work and work Astronomer.  Reproductive health. Screening  You may have the following tests or measurements:  Height, weight, and BMI.  Blood pressure.  Lipid and cholesterol levels. These may be checked every 5 years, or more frequently if you are over 47 years old.  Skin check.  Lung cancer screening. You may have this screening every year starting at age 19 if you have a 30-pack-year history of smoking and currently smoke or have quit within the past 15 years.  Fecal occult blood test (FOBT) of the stool. You may have this test every year starting at age 98.  Flexible sigmoidoscopy or colonoscopy. You may have a sigmoidoscopy every 5 years or a colonoscopy every 10 years starting at age 53.  Hepatitis C blood test.  Hepatitis B blood test.  Sexually transmitted disease (STD) testing.  Diabetes screening. This is done by checking your blood sugar (glucose) after you have not eaten for a while (fasting). You may have this done every 1-3 years.  Bone density scan. This is done to screen for osteoporosis. You may have this done starting at age 66.  Mammogram. This may be done every 1-2 years. Talk to your health care provider about how often you should have regular mammograms. Talk with your health care provider about your test results, treatment options, and if necessary, the need for more tests. Vaccines  Your health care provider may recommend certain vaccines, such as:  Influenza vaccine. This is recommended every year.  Tetanus, diphtheria, and acellular pertussis (Tdap, Td) vaccine. You may need a Td booster every 10 years.  Zoster vaccine. You may need this after age  60.  Pneumococcal 13-valent conjugate (PCV13) vaccine. One dose is recommended after age 79.  Pneumococcal polysaccharide (PPSV23) vaccine. One dose is recommended after age 5. Talk to your health care  provider about which screenings and vaccines you need and how often you need them. This information is not intended to replace advice given to you by your health care provider. Make sure you discuss any questions you have with your health care provider. Document Released: 11/20/2015 Document Revised: 07/13/2016 Document Reviewed: 08/25/2015 Elsevier Interactive Patient Education  2017 [place] Prevention in the Home Falls can cause injuries. They can happen to people of all ages. There are many things you can do to make your home safe and to help prevent falls. What can I do on the outside of my home?  Regularly fix the edges of walkways and driveways and fix any cracks.  Remove anything that might make you trip as you walk through a door, such as a raised step or threshold.  Trim any bushes or trees on the path to your home.  Use bright outdoor lighting.  Clear any walking paths of anything that might make someone trip, such as rocks or tools.  Regularly check to see if handrails are loose or broken. Make sure that both sides of any steps have handrails.  Any raised decks and porches should have guardrails on the edges.  Have any leaves, snow, or ice cleared regularly.  Use sand or salt on walking paths during winter.  Clean up any spills in your garage right away. This includes oil or grease spills. What can I do in the bathroom?  Use night lights.  Install grab bars by the toilet and in the tub and shower. Do not use towel bars as grab bars.  Use non-skid mats or decals in the tub or shower.  If you need to sit down in the shower, use a plastic, non-slip stool.  Keep the floor dry. Clean up any water that spills on the floor as soon as it happens.  Remove soap buildup in the tub or shower regularly.  Attach bath mats securely with double-sided non-slip rug tape.  Do not have throw rugs and other things on the floor that can make you trip. What can I do in  the bedroom?  Use night lights.  Make sure that you have a light by your bed that is easy to reach.  Do not use any sheets or blankets that are too big for your bed. They should not hang down onto the floor.  Have a firm chair that has side arms. You can use this for support while you get dressed.  Do not have throw rugs and other things on the floor that can make you trip. What can I do in the kitchen?  Clean up any spills right away.  Avoid walking on wet floors.  Keep items that you use a lot in easy-to-reach places.  If you need to reach something above you, use a strong step stool that has a grab bar.  Keep electrical cords out of the way.  Do not use floor polish or wax that makes floors slippery. If you must use wax, use non-skid floor wax.  Do not have throw rugs and other things on the floor that can make you trip. What can I do with my stairs?  Do not leave any items on the stairs.  Make sure that there are handrails on both sides of the stairs and  use them. Fix handrails that are broken or loose. Make sure that handrails are as long as the stairways.  Check any carpeting to make sure that it is firmly attached to the stairs. Fix any carpet that is loose or worn.  Avoid having throw rugs at the top or bottom of the stairs. If you do have throw rugs, attach them to the floor with carpet tape.  Make sure that you have a light switch at the top of the stairs and the bottom of the stairs. If you do not have them, ask someone to add them for you. What else can I do to help prevent falls?  Wear shoes that:  Do not have high heels.  Have rubber bottoms.  Are comfortable and fit you well.  Are closed at the toe. Do not wear sandals.  If you use a stepladder:  Make sure that it is fully opened. Do not climb a closed stepladder.  Make sure that both sides of the stepladder are locked into place.  Ask someone to hold it for you, if possible.  Clearly mark and  make sure that you can see:  Any grab bars or handrails.  First and last steps.  Where the edge of each step is.  Use tools that help you move around (mobility aids) if they are needed. These include:  Canes.  Walkers.  Scooters.  Crutches.  Turn on the lights when you go into a dark area. Replace any light bulbs as soon as they burn out.  Set up your furniture so you have a clear path. Avoid moving your furniture around.  If any of your floors are uneven, fix them.  If there are any pets around you, be aware of where they are.  Review your medicines with your doctor. Some medicines can make you feel dizzy. This can increase your chance of falling. Ask your doctor what other things that you can do to help prevent falls. This information is not intended to replace advice given to you by your health care provider. Make sure you discuss any questions you have with your health care provider. Document Released: 08/20/2009 Document Revised: 03/31/2016 Document Reviewed: 11/28/2014 Elsevier Interactive Patient Education  2017 [employer].

## 2020-01-30 ENCOUNTER — Other Ambulatory Visit: Payer: Self-pay

## 2020-01-30 ENCOUNTER — Encounter: Payer: Self-pay | Admitting: Internal Medicine

## 2020-01-30 ENCOUNTER — Ambulatory Visit (INDEPENDENT_AMBULATORY_CARE_PROVIDER_SITE_OTHER): Payer: Medicare Other | Admitting: Internal Medicine

## 2020-01-30 VITALS — BP 134/86 | HR 83 | Temp 98.9°F | Ht <= 58 in | Wt 156.6 lb

## 2020-01-30 DIAGNOSIS — N182 Chronic kidney disease, stage 2 (mild): Secondary | ICD-10-CM | POA: Diagnosis not present

## 2020-01-30 DIAGNOSIS — E1122 Type 2 diabetes mellitus with diabetic chronic kidney disease: Secondary | ICD-10-CM | POA: Diagnosis not present

## 2020-01-30 DIAGNOSIS — E041 Nontoxic single thyroid nodule: Secondary | ICD-10-CM

## 2020-01-30 DIAGNOSIS — Z0000 Encounter for general adult medical examination without abnormal findings: Secondary | ICD-10-CM | POA: Diagnosis not present

## 2020-01-30 DIAGNOSIS — E6609 Other obesity due to excess calories: Secondary | ICD-10-CM

## 2020-01-30 DIAGNOSIS — Z712 Person consulting for explanation of examination or test findings: Secondary | ICD-10-CM | POA: Diagnosis not present

## 2020-01-30 DIAGNOSIS — Z6833 Body mass index (BMI) 33.0-33.9, adult: Secondary | ICD-10-CM

## 2020-01-30 DIAGNOSIS — I129 Hypertensive chronic kidney disease with stage 1 through stage 4 chronic kidney disease, or unspecified chronic kidney disease: Secondary | ICD-10-CM

## 2020-01-30 LAB — POCT URINALYSIS DIPSTICK
Bilirubin, UA: NEGATIVE
Blood, UA: NEGATIVE
Glucose, UA: NEGATIVE
Ketones, UA: NEGATIVE
Nitrite, UA: NEGATIVE
Protein, UA: NEGATIVE
Spec Grav, UA: 1.025 (ref 1.010–1.025)
Urobilinogen, UA: 0.2 E.U./dL
pH, UA: 5.5 (ref 5.0–8.0)

## 2020-01-30 LAB — POCT UA - MICROALBUMIN
Albumin/Creatinine Ratio, Urine, POC: <30
Creatinine, POC: 200 mg/dL
Microalbumin Ur, POC: 10 mg/L

## 2020-01-30 NOTE — Patient Instructions (Signed)
Health Maintenance, Female Adopting a healthy lifestyle and getting preventive care are important in promoting health and wellness. Ask your health care provider about:  The right schedule for you to have regular tests and exams.  Things you can do on your own to prevent diseases and keep yourself healthy. What should I know about diet, weight, and exercise? Eat a healthy diet   Eat a diet that includes plenty of vegetables, fruits, low-fat dairy products, and lean protein.  Do not eat a lot of foods that are high in solid fats, added sugars, or sodium. Maintain a healthy weight Body mass index (BMI) is used to identify weight problems. It estimates body fat based on height and weight. Your health care provider can help determine your BMI and help you achieve or maintain a healthy weight. Get regular exercise Get regular exercise. This is one of the most important things you can do for your health. Most adults should:  Exercise for at least 150 minutes each week. The exercise should increase your heart rate and make you sweat (moderate-intensity exercise).  Do strengthening exercises at least twice a week. This is in addition to the moderate-intensity exercise.  Spend less time sitting. Even light physical activity can be beneficial. Watch cholesterol and blood lipids Have your blood tested for lipids and cholesterol at 73 years of age, then have this test every 5 years. Have your cholesterol levels checked more often if:  Your lipid or cholesterol levels are high.  You are older than 73 years of age.  You are at high risk for heart disease. What should I know about cancer screening? Depending on your health history and family history, you may need to have cancer screening at various ages. This may include screening for:  Breast cancer.  Cervical cancer.  Colorectal cancer.  Skin cancer.  Lung cancer. What should I know about heart disease, diabetes, and high blood  pressure? Blood pressure and heart disease  High blood pressure causes heart disease and increases the risk of stroke. This is more likely to develop in people who have high blood pressure readings, are of African descent, or are overweight.  Have your blood pressure checked: ? Every 3-5 years if you are 18-39 years of age. ? Every year if you are 40 years old or older. Diabetes Have regular diabetes screenings. This checks your fasting blood sugar level. Have the screening done:  Once every three years after age 40 if you are at a normal weight and have a low risk for diabetes.  More often and at a younger age if you are overweight or have a high risk for diabetes. What should I know about preventing infection? Hepatitis B If you have a higher risk for hepatitis B, you should be screened for this virus. Talk with your health care provider to find out if you are at risk for hepatitis B infection. Hepatitis C Testing is recommended for:  Everyone born from 1945 through 1965.  Anyone with known risk factors for hepatitis C. Sexually transmitted infections (STIs)  Get screened for STIs, including gonorrhea and chlamydia, if: ? You are sexually active and are younger than 73 years of age. ? You are older than 73 years of age and your health care provider tells you that you are at risk for this type of infection. ? Your sexual activity has changed since you were last screened, and you are at increased risk for chlamydia or gonorrhea. Ask your health care provider if   you are at risk.  Ask your health care provider about whether you are at high risk for HIV. Your health care provider may recommend a prescription medicine to help prevent HIV infection. If you choose to take medicine to prevent HIV, you should first get tested for HIV. You should then be tested every 3 months for as long as you are taking the medicine. Pregnancy  If you are about to stop having your period (premenopausal) and  you may become pregnant, seek counseling before you get pregnant.  Take 400 to 800 micrograms (mcg) of folic acid every day if you become pregnant.  Ask for birth control (contraception) if you want to prevent pregnancy. Osteoporosis and menopause Osteoporosis is a disease in which the bones lose minerals and strength with aging. This can result in bone fractures. If you are 65 years old or older, or if you are at risk for osteoporosis and fractures, ask your health care provider if you should:  Be screened for bone loss.  Take a calcium or vitamin D supplement to lower your risk of fractures.  Be given hormone replacement therapy (HRT) to treat symptoms of menopause. Follow these instructions at home: Lifestyle  Do not use any products that contain nicotine or tobacco, such as cigarettes, e-cigarettes, and chewing tobacco. If you need help quitting, ask your health care provider.  Do not use street drugs.  Do not share needles.  Ask your health care provider for help if you need support or information about quitting drugs. Alcohol use  Do not drink alcohol if: ? Your health care provider tells you not to drink. ? You are pregnant, may be pregnant, or are planning to become pregnant.  If you drink alcohol: ? Limit how much you use to 0-1 drink a day. ? Limit intake if you are breastfeeding.  Be aware of how much alcohol is in your drink. In the [place] one drink equals one 12 oz bottle of beer (355 mL), one 5 oz glass of wine (148 mL), or one 1 oz glass of hard liquor (44 mL). General instructions  Schedule regular health, dental, and eye exams.  Stay current with your vaccines.  Tell your health care provider if: ? You often feel depressed. ? You have ever been abused or do not feel safe at home. Summary  Adopting a healthy lifestyle and getting preventive care are important in promoting health and wellness.  Follow your health care provider's instructions about healthy  diet, exercising, and getting tested or screened for diseases.  Follow your health care provider's instructions on monitoring your cholesterol and blood pressure. This information is not intended to replace advice given to you by your health care provider. Make sure you discuss any questions you have with your health care provider. Document Revised: 10/17/2018 Document Reviewed: 10/17/2018 Elsevier Patient Education  2020 [employer] Inc.  

## 2020-01-31 LAB — CBC
Hematocrit: 37.6 % (ref 34.0–46.6)
Hemoglobin: 11.1 g/dL (ref 11.1–15.9)
MCH: 22.3 pg — ABNORMAL LOW (ref 26.6–33.0)
MCHC: 29.5 g/dL — ABNORMAL LOW (ref 31.5–35.7)
MCV: 76 fL — ABNORMAL LOW (ref 79–97)
Platelets: 372 10*3/uL (ref 150–450)
RBC: 4.97 x10E6/uL (ref 3.77–5.28)
RDW: 15.1 % (ref 11.7–15.4)
WBC: 5.1 10*3/uL (ref 3.4–10.8)

## 2020-01-31 LAB — TSH: TSH: 1.57 u[IU]/mL (ref 0.450–4.500)

## 2020-01-31 LAB — LIPID PANEL
Chol/HDL Ratio: 2.2 ratio (ref 0.0–4.4)
Cholesterol, Total: 139 mg/dL (ref 100–199)
HDL: 63 mg/dL (ref >39)
LDL Chol Calc (NIH): 59 mg/dL (ref 0–99)
Triglycerides: 94 mg/dL (ref 0–149)
VLDL Cholesterol Cal: 17 mg/dL (ref 5–40)

## 2020-01-31 LAB — CMP14+EGFR
ALT: 15 IU/L (ref 0–32)
AST: 22 IU/L (ref 0–40)
Albumin/Globulin Ratio: 1.6 (ref 1.2–2.2)
Albumin: 4.5 g/dL (ref 3.7–4.7)
Alkaline Phosphatase: 94 IU/L (ref 39–117)
BUN/Creatinine Ratio: 13 (ref 12–28)
BUN: 12 mg/dL (ref 8–27)
Bilirubin Total: 0.6 mg/dL (ref 0.0–1.2)
CO2: 23 mmol/L (ref 20–29)
Calcium: 9.6 mg/dL (ref 8.7–10.3)
Chloride: 104 mmol/L (ref 96–106)
Creatinine, Ser: 0.92 mg/dL (ref 0.57–1.00)
GFR calc Af Amer: 72 mL/min/{1.73_m2} (ref >59)
GFR calc non Af Amer: 62 mL/min/{1.73_m2} (ref >59)
Globulin, Total: 2.8 g/dL (ref 1.5–4.5)
Glucose: 111 mg/dL — ABNORMAL HIGH (ref 65–99)
Potassium: 4.3 mmol/L (ref 3.5–5.2)
Sodium: 141 mmol/L (ref 134–144)
Total Protein: 7.3 g/dL (ref 6.0–8.5)

## 2020-01-31 LAB — HEMOGLOBIN A1C
Est. average glucose Bld gHb Est-mCnc: 137 mg/dL
Hgb A1c MFr Bld: 6.4 % — ABNORMAL HIGH (ref 4.8–5.6)

## 2020-01-31 NOTE — Progress Notes (Signed)
This visit occurred during the SARS-CoV-2 public health emergency.  Safety protocols were in place, including screening questions prior to the visit, additional usage of staff PPE, and extensive cleaning of exam room while observing appropriate contact time as indicated for disinfecting solutions.  Subjective:     Patient ID: Anita Carpenter , female    DOB: May 11, 1946 , 74 y.o.   MRN: [id number]   Chief Complaint  Patient presents with  . Annual Exam  . Hypertension  . Diabetes    HPI  She is here today for a full physical examination. She is no longer followed by GYN. Does not wish to have pelvic exam today. She has no specific concerns or complaints at this time.   Hypertension This is a chronic problem. The current episode started more than 1 year ago. The problem has been gradually improving since onset. The problem is controlled. Pertinent negatives include no blurred vision, chest pain, palpitations or shortness of breath. Risk factors for coronary artery disease include sedentary lifestyle and obesity. Past treatments include angiotensin blockers and diuretics. The current treatment provides moderate improvement.  Diabetes She presents for her follow-up diabetic visit. She has type 2 diabetes mellitus. There are no hypoglycemic associated symptoms. Pertinent negatives for diabetes include no blurred vision and no chest pain. There are no hypoglycemic complications. Risk factors for coronary artery disease include diabetes mellitus, dyslipidemia, obesity, sedentary lifestyle and post-menopausal.     Past Medical History:  Diagnosis Date  . Asthma   . Diabetes mellitus without complication ([place])   . High cholesterol   . Hypertension      Family History  Problem Relation Age of Onset  . Asthma Other   . Diabetes Other   . Hyperlipidemia Other   . Hyperlipidemia Mother   . Hypertension Mother   . Kidney failure Father   . Stroke Father      Current Outpatient  Medications:  .  amLODipine (NORVASC) 5 MG tablet, Take 1 tablet (5 mg total) by mouth daily., Disp: 90 tablet, Rfl: 1 .  Cholecalciferol (VITAMIN D3) 5000 UNITS CAPS, Take 5,000 Units by mouth every other day. , Disp: , Rfl:  .  ferrous sulfate 325 (65 FE) MG tablet, Take 1 tablet (325 mg total) by mouth daily with breakfast., Disp: 30 tablet, Rfl: 3 .  glucose blood (ONETOUCH VERIO) test strip, 1 each by Other route daily. Use as instructed, Disp: , Rfl:  .  hydrALAZINE (APRESOLINE) 25 MG tablet, Take 1 tablet (25 mg total) by mouth daily., Disp: 90 tablet, Rfl: 1 .  metoprolol tartrate (LOPRESSOR) 25 MG tablet, Take 1 tablet (25 mg total) by mouth daily., Disp: 90 tablet, Rfl: 1 .  rosuvastatin (CRESTOR) 10 MG tablet, Take 1 tablet (10 mg total) by mouth daily., Disp: 90 tablet, Rfl: 1 .  olmesartan-hydrochlorothiazide (BENICAR HCT) 40-25 MG tablet, Take 1 tablet by mouth daily., Disp: , Rfl:    Allergies  Allergen Reactions  . Doxycycline Rash     The patient states she uses none for birth control. Last LMP was No LMP recorded. Patient is postmenopausal.. Negative for Dysmenorrhea  Negative for: breast discharge, breast lump(s), breast pain and breast self exam. Associated symptoms include abnormal vaginal bleeding. Pertinent negatives include abnormal bleeding (hematology), anxiety, decreased libido, depression, difficulty falling sleep, dyspareunia, history of infertility, nocturia, sexual dysfunction, sleep disturbances, urinary incontinence, urinary urgency, vaginal discharge and vaginal itching. Diet regular.The patient states her exercise level is  minimal.   .  The patient's tobacco use is:  Social History   Tobacco Use  Smoking Status Former Smoker  . Packs/day: 0.25  . Years: 50.00  . Pack years: 12.50  . Types: Cigarettes  . Start date: 69  . Quit date: 12/03/2018  . Years since quitting: 1.1  Smokeless Tobacco Never Used  Tobacco Comment   1ppdx  . She has been exposed  to passive smoke. The patient's alcohol use is:  Social History   Substance and Sexual Activity  Alcohol Use Not Currently    Review of Systems  Constitutional: Negative.   HENT: Negative.   Eyes: Negative.  Negative for blurred vision.  Respiratory: Negative.  Negative for shortness of breath.   Cardiovascular: Negative.  Negative for chest pain and palpitations.  Endocrine: Negative.   Genitourinary: Negative.   Musculoskeletal: Negative.   Skin: Negative.   Allergic/Immunologic: Negative.   Neurological: Negative.   Hematological: Negative.   Psychiatric/Behavioral: Negative.      Today's Vitals   01/30/20 1114  BP: 134/86  Pulse: 83  Temp: 98.9 F (37.2 C)  TempSrc: Oral  Weight: 156 lb 9.6 oz (71 kg)  Height: 4' 9.2" (1.453 m)  PainSc: 0-No pain   Body mass index is 33.65 kg/m.   Objective:  Physical Exam Vitals and nursing note reviewed.  Constitutional:      Appearance: Normal appearance. She is obese.  HENT:     Head: Normocephalic and atraumatic.     Right Ear: Tympanic membrane, ear canal and external ear normal.     Left Ear: Tympanic membrane, ear canal and external ear normal.     Nose:     Comments: Deferred, masked    Mouth/Throat:     Comments: Deferred, masked Eyes:     Extraocular Movements: Extraocular movements intact.     Conjunctiva/sclera: Conjunctivae normal.     Pupils: Pupils are equal, round, and reactive to light.  Neck:     Thyroid: Thyromegaly present.  Cardiovascular:     Rate and Rhythm: Normal rate and regular rhythm.     Pulses:          Dorsalis pedis pulses are 1+ on the right side and 1+ on the left side.     Heart sounds: Normal heart sounds.  Pulmonary:     Effort: Pulmonary effort is normal.     Breath sounds: Normal breath sounds.  Abdominal:     General: Abdomen is flat. Bowel sounds are normal.     Palpations: Abdomen is soft.  Genitourinary:    Comments: deferred Musculoskeletal:        General: Normal  range of motion.     Cervical back: Normal range of motion and neck supple.  Feet:     Right foot:     Protective Sensation: 5 sites tested. 5 sites sensed.     Skin integrity: Dry skin present.     Toenail Condition: Right toenails are normal.     Left foot:     Protective Sensation: 5 sites tested. 5 sites sensed.     Skin integrity: Dry skin present.     Toenail Condition: Left toenails are normal.  Lymphadenopathy:     Cervical: No cervical adenopathy.  Skin:    General: Skin is warm and dry.  Neurological:     General: No focal deficit present.     Mental Status: She is alert and oriented to person, place, and time.  Psychiatric:  Mood and Affect: Mood normal.        Behavior: Behavior normal.         Assessment And Plan:     1. Routine general medical examination at health care facility  PE performed, except pelvic exam. Importance of monthly self breast exams was discussed with the patient. PATIENT IS ADVISED TO GET 30-45 MINUTES REGULAR EXERCISE NO LESS THAN FOUR TO FIVE DAYS PER WEEK - BOTH WEIGHTBEARING EXERCISES AND AEROBIC ARE RECOMMENDED.  SHE IS ADVISED TO FOLLOW A HEALTHY DIET WITH AT LEAST SIX FRUITS/VEGGIES PER DAY, DECREASE INTAKE OF RED MEAT, AND TO INCREASE FISH INTAKE TO TWO DAYS PER WEEK.  MEATS/FISH SHOULD NOT BE FRIED, BAKED OR BROILED IS PREFERABLE.  I SUGGEST WEARING SPF 50 SUNSCREEN ON EXPOSED PARTS AND ESPECIALLY WHEN IN THE DIRECT SUNLIGHT FOR AN EXTENDED PERIOD OF TIME.  PLEASE AVOID FAST FOOD RESTAURANTS AND INCREASE YOUR WATER INTAKE.   2. Hypertensive nephropathy  Chronic, fair control.  She will continue with current meds. She is encouraged to avoid adding salt to her foods. EKG performed, NSR w/o acute changes. She will rto in six months for re-evaluation.   - EKG 12-Lead - CMP14+EGFR - Lipid panel - TSH  3. Type 2 diabetes mellitus with stage 2 chronic kidney disease, without long-term current use of insulin ([place])  Diabetic foot  exam was performed.  I DISCUSSED WITH THE PATIENT AT LENGTH REGARDING THE GOALS OF GLYCEMIC CONTROL AND POSSIBLE LONG-TERM COMPLICATIONS.  I  ALSO STRESSED THE IMPORTANCE OF COMPLIANCE WITH HOME GLUCOSE MONITORING, DIETARY RESTRICTIONS INCLUDING AVOIDANCE OF SUGARY DRINKS/PROCESSED FOODS,  ALONG WITH REGULAR EXERCISE.  I  ALSO STRESSED THE IMPORTANCE OF ANNUAL EYE EXAMS, SELF FOOT CARE AND COMPLIANCE WITH OFFICE VISITS.  - POCT Urinalysis Dipstick (81002) - POCT UA - Microalbumin - CMP14+EGFR - CBC - Lipid panel - Hemoglobin A1c - TSH  4. Class 1 obesity due to excess calories with serious comorbidity and body mass index (BMI) of 33.0 to 33.9 in adult  She is encouraged to incorporate more exercise into her daily routine. She is advised to aim for at least 150 minutes of exercise per week.  She is encouraged to strive for BMI less than 30 to decrease cardiac risk.   5. Thyroid nodule  Recent thyroid u/s results reviewed in full detail during her visit. Also reviewed pathology report. No further testing needed at this time. Will consider repeat thyroid u/s in 1-2 years.    [name], MD    THE PATIENT IS ENCOURAGED TO PRACTICE SOCIAL DISTANCING DUE TO THE COVID-19 PANDEMIC.

## 2020-06-01 ENCOUNTER — Ambulatory Visit: Payer: Medicare Other | Admitting: Internal Medicine

## 2020-06-20 ENCOUNTER — Other Ambulatory Visit: Payer: Self-pay | Admitting: Internal Medicine

## 2020-06-23 ENCOUNTER — Other Ambulatory Visit: Payer: Self-pay | Admitting: Physician Assistant

## 2020-06-23 ENCOUNTER — Telehealth: Payer: Self-pay

## 2020-06-23 DIAGNOSIS — U071 COVID-19: Secondary | ICD-10-CM

## 2020-06-23 DIAGNOSIS — I129 Hypertensive chronic kidney disease with stage 1 through stage 4 chronic kidney disease, or unspecified chronic kidney disease: Secondary | ICD-10-CM

## 2020-06-23 DIAGNOSIS — IMO0002 Reserved for concepts with insufficient information to code with codable children: Secondary | ICD-10-CM

## 2020-06-23 DIAGNOSIS — E1122 Type 2 diabetes mellitus with diabetic chronic kidney disease: Secondary | ICD-10-CM

## 2020-06-23 DIAGNOSIS — N182 Chronic kidney disease, stage 2 (mild): Secondary | ICD-10-CM

## 2020-06-23 NOTE — Progress Notes (Signed)
I connected by phone with Anita Carpenter on 06/23/2020 at 7:06 PM to discuss the potential use of a new treatment for mild to moderate COVID-19 viral infection in non-hospitalized patients.  This patient is a 74 y.o. female that meets the FDA criteria for Emergency Use Authorization of COVID monoclonal antibody casirivimab/imdevimab.  Has a (+) direct SARS-CoV-2 viral test result  Has mild or moderate COVID-19   Is NOT hospitalized due to COVID-19  Is within 10 days of symptom onset  Has at least one of the high risk factor(s) for progression to severe COVID-19 and/or hospitalization as defined in EUA.  Specific high risk criteria : Older age (>/= 73 yo), HTN, DMT2, Asthma   I have spoken and communicated the following to the patient or parent/caregiver regarding COVID monoclonal antibody treatment:  1. FDA has authorized the emergency use for the treatment of mild to moderate COVID-19 in adults and pediatric patients with positive results of direct SARS-CoV-2 viral testing who are 26 years of age and older weighing at least 40 kg, and who are at high risk for progressing to severe COVID-19 and/or hospitalization.  2. The significant known and potential risks and benefits of COVID monoclonal antibody, and the extent to which such potential risks and benefits are unknown.  3. Information on available alternative treatments and the risks and benefits of those alternatives, including clinical trials.  4. Patients treated with COVID monoclonal antibody should continue to self-isolate and use infection control measures (e.g., wear mask, isolate, social distance, avoid sharing personal items, clean and disinfect "high touch" surfaces, and frequent handwashing) according to CDC guidelines.   5. The patient or parent/caregiver has the option to accept or refuse COVID monoclonal antibody treatment.  After reviewing this information with the patient, The patient agreed to proceed with receiving  casirivimab\imdevimab infusion and will be provided a copy of the Fact sheet prior to receiving the infusion.  Sx onset 8/10. Set up for infusion on 8/18 @ 2pm. Directions given to [facility]. Pt is aware that insurance will be charged an infusion fee. Of note, pt is fully vaccinated with pfizer and added to our breakthrough list.   Cline Crock 06/23/2020 7:06 PM

## 2020-06-23 NOTE — Telephone Encounter (Signed)
Pt left vm that she had tested positive for covid. Returned call to pt to get more information. Pt stated that the woman from the CDC advised her to call PCP to ask for infusion tx for covid. Pt is only experiencing stuffiness at this time. Stated that physically she feels fine but mentally shes scared of possibly getting other sick. Pt was vaccinated in march and April. She tested for covid on 06/19/20 and received positive results on 06/22/20. A message was left for the infusion center at [phone]. Pt notified of this and encouraged to keep phone near by. Pt also encouraged to call office with any changes in symptoms and if any changes in breathing occurred for her to go to urgent care or emergency room.

## 2020-06-24 ENCOUNTER — Ambulatory Visit (HOSPITAL_COMMUNITY)
Admission: RE | Admit: 2020-06-24 | Discharge: 2020-06-24 | Disposition: A | Discharge status: Home / Self Care | Payer: Medicare Other | Source: Ambulatory Visit | Attending: Pulmonary Disease | Admitting: Pulmonary Disease

## 2020-06-24 DIAGNOSIS — E1122 Type 2 diabetes mellitus with diabetic chronic kidney disease: Secondary | ICD-10-CM | POA: Insufficient documentation

## 2020-06-24 DIAGNOSIS — N182 Chronic kidney disease, stage 2 (mild): Secondary | ICD-10-CM | POA: Diagnosis present

## 2020-06-24 DIAGNOSIS — U071 COVID-19: Secondary | ICD-10-CM | POA: Insufficient documentation

## 2020-06-24 DIAGNOSIS — Z23 Encounter for immunization: Secondary | ICD-10-CM | POA: Diagnosis not present

## 2020-06-24 DIAGNOSIS — I129 Hypertensive chronic kidney disease with stage 1 through stage 4 chronic kidney disease, or unspecified chronic kidney disease: Secondary | ICD-10-CM | POA: Diagnosis present

## 2020-06-24 DIAGNOSIS — IMO0002 Reserved for concepts with insufficient information to code with codable children: Secondary | ICD-10-CM

## 2020-06-24 DIAGNOSIS — E1165 Type 2 diabetes mellitus with hyperglycemia: Secondary | ICD-10-CM | POA: Insufficient documentation

## 2020-06-24 MED ORDER — ALBUTEROL SULFATE HFA 108 (90 BASE) MCG/ACT IN AERS: 2.0000 | INHALATION_SPRAY | Freq: Once | RESPIRATORY_TRACT | Status: DC | PRN

## 2020-06-24 MED ORDER — SODIUM CHLORIDE 0.9 % IV SOLN: INTRAVENOUS | Status: DC | PRN

## 2020-06-24 MED ORDER — SODIUM CHLORIDE 0.9 % IV SOLN
1200.0000 mg | Freq: Once | INTRAVENOUS | Status: AC
  Administered 2020-06-24: 1200 mg via INTRAVENOUS
  Filled 2020-06-24: qty 10

## 2020-06-24 MED ORDER — METHYLPREDNISOLONE SODIUM SUCC 125 MG IJ SOLR: 125.0000 mg | Freq: Once | INTRAMUSCULAR | Status: DC | PRN

## 2020-06-24 MED ORDER — DIPHENHYDRAMINE HCL 50 MG/ML IJ SOLN: 50.0000 mg | Freq: Once | INTRAMUSCULAR | Status: DC | PRN

## 2020-06-24 MED ORDER — FAMOTIDINE IN NACL 20-0.9 MG/50ML-% IV SOLN: 20.0000 mg | Freq: Once | INTRAVENOUS | Status: DC | PRN

## 2020-06-24 MED ORDER — EPINEPHRINE 0.3 MG/0.3ML IJ SOAJ: 0.3000 mg | Freq: Once | INTRAMUSCULAR | Status: DC | PRN

## 2020-06-24 NOTE — Progress Notes (Signed)
  Diagnosis: COVID-19  Physician: Dr Patrick Wright  Procedure: Covid Infusion Clinic Med: casirivimab\imdevimab infusion - Provided patient with casirivimab\imdevimab fact sheet for patients, parents and caregivers prior to infusion.  Complications: No immediate complications noted.  Discharge: Discharged home   Anita Carpenter 06/24/2020   

## 2020-06-24 NOTE — Discharge Instructions (Signed)

## 2020-07-01 ENCOUNTER — Telehealth: Payer: Self-pay

## 2020-07-01 NOTE — Telephone Encounter (Signed)
The pt wanted to know if she should be retested for Covid until she tests negative, that she has mucous in her throat and nose that she can't get up.  The pt was told that Dr. Allyne Gee said to have someone drop off her some Mucinex and that she didn't need to retest right now.  The pt wants to know if she is going to have another infusion to help with the covid and when can she get retested?

## 2020-07-01 NOTE — Telephone Encounter (Signed)
Why does she want to get retested? She needs to quarantine for at least ten days. I am not aware of a need for repeat antibody treatment. It can up to a month or more to fully recover. Be sure to eat a clean diet free of sodas, sugary drinks and processed foods. Be sure to take vitamin d,vitamin c and zinc.

## 2020-07-02 ENCOUNTER — Telehealth: Payer: Self-pay

## 2020-07-02 NOTE — Telephone Encounter (Signed)
The pt said her last day for quarantine was yesterday and that she wanted to get retested to make sure that the covid was gone.

## 2020-07-02 NOTE — Telephone Encounter (Signed)
The pt was told that she can come here for a covid test but that it would have to be an outside visit.  The pt agreed and she scheduled for next Monday.  The pt was told to call the office when she is on her way.

## 2020-07-06 ENCOUNTER — Other Ambulatory Visit: Payer: Self-pay

## 2020-07-06 ENCOUNTER — Ambulatory Visit: Payer: Medicare Other

## 2020-07-06 DIAGNOSIS — Z1152 Encounter for screening for COVID-19: Secondary | ICD-10-CM

## 2020-07-07 LAB — NOVEL CORONAVIRUS, NAA: SARS-CoV-2, NAA: NOT DETECTED

## 2020-08-10 ENCOUNTER — Telehealth: Payer: Self-pay

## 2020-08-10 NOTE — Telephone Encounter (Signed)
She can get at end of the month

## 2020-08-10 NOTE — Telephone Encounter (Signed)
The pt wants to know how long should she wait before she gets a covid booster because she had covid and had an antibody treatment.  The pt was asked what date was her treatment and the pt said it was August 18th.

## 2020-08-11 NOTE — Telephone Encounter (Signed)
If she gets flu shot now, then she will need to wait at least two weeks for COVID vaccine

## 2020-08-11 NOTE — Telephone Encounter (Signed)
The pt wants to know is it ok for her to get a flu shot

## 2020-08-12 ENCOUNTER — Telehealth: Payer: Self-pay

## 2020-08-12 NOTE — Telephone Encounter (Signed)
The pt was notified that Dr. Allyne Gee said the pt should wait at least 2 weeks in between getting her flu and covid booster vaccinations.

## 2020-08-18 ENCOUNTER — Other Ambulatory Visit: Payer: Self-pay

## 2020-08-18 ENCOUNTER — Ambulatory Visit (INDEPENDENT_AMBULATORY_CARE_PROVIDER_SITE_OTHER): Payer: Medicare Other

## 2020-08-18 VITALS — BP 122/76 | HR 66 | Temp 98.1°F | Ht <= 58 in | Wt 149.0 lb

## 2020-08-18 DIAGNOSIS — Z23 Encounter for immunization: Secondary | ICD-10-CM | POA: Diagnosis not present

## 2020-08-18 NOTE — Progress Notes (Signed)
Pt here for flu shot

## 2020-08-31 LAB — HM MAMMOGRAPHY

## 2020-09-03 ENCOUNTER — Encounter: Payer: Self-pay | Admitting: Internal Medicine

## 2020-09-19 ENCOUNTER — Ambulatory Visit: Payer: Medicare Other | Attending: Internal Medicine

## 2020-09-19 ENCOUNTER — Other Ambulatory Visit: Payer: Self-pay

## 2020-09-19 DIAGNOSIS — Z23 Encounter for immunization: Secondary | ICD-10-CM

## 2020-09-19 NOTE — Progress Notes (Signed)
   Covid-19 Vaccination Clinic  Name:  Anita Carpenter    MRN: [id number] DOB: 07-14-1946  09/19/2020  [name] was observed post Covid-19 immunization for 15 minutes without incident. She was provided with Vaccine Information Sheet and instruction to access the V-Safe system.   [name] was instructed to call 911 with any severe reactions post vaccine: Marland Kitchen Difficulty breathing  . Swelling of face and throat  . A fast heartbeat  . A bad rash all over body  . Dizziness and weakness   Immunizations Administered    Name Date Dose VIS Date Route   Pfizer COVID-19 Vaccine 09/19/2020  1:46 PM 0.3 mL 08/26/2020 Intramuscular   Manufacturer: [employer], [employer]   Lot: [id number]   NDC: [address]-7

## 2020-11-22 IMAGING — CT CT CHEST LUNG CANCER SCREENING LOW DOSE
2 of 5 series · 14 of 40 positions shown, 17 images · non-contrast
Comparison: No comparison studies available.

CLINICAL DATA: 72-year-old female with 35 pack year history of
smoking. Lung cancer screening.

EXAM:
CT CHEST WITHOUT CONTRAST LOW-DOSE FOR LUNG CANCER SCREENING
TECHNIQUE: Multidetector CT imaging of the chest was performed following the
standard protocol without IV contrast.

[Series 4: lung 1.00 br44 cor · coronal · 0.53mm/px · 3 of 283 slices shown]
[im 57/283  lung]
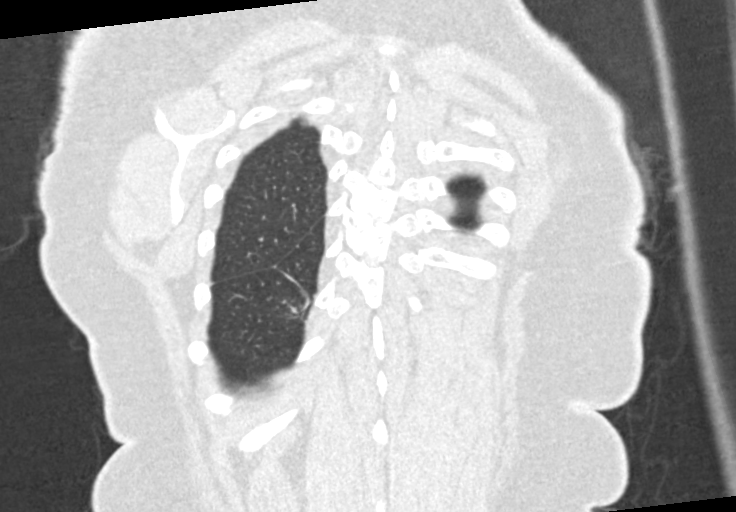
[im 113/283  lung]
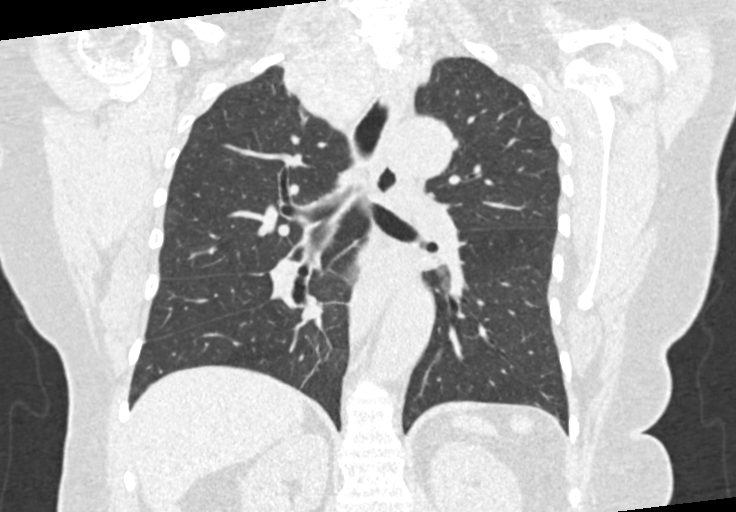
[im 170/283  lung]
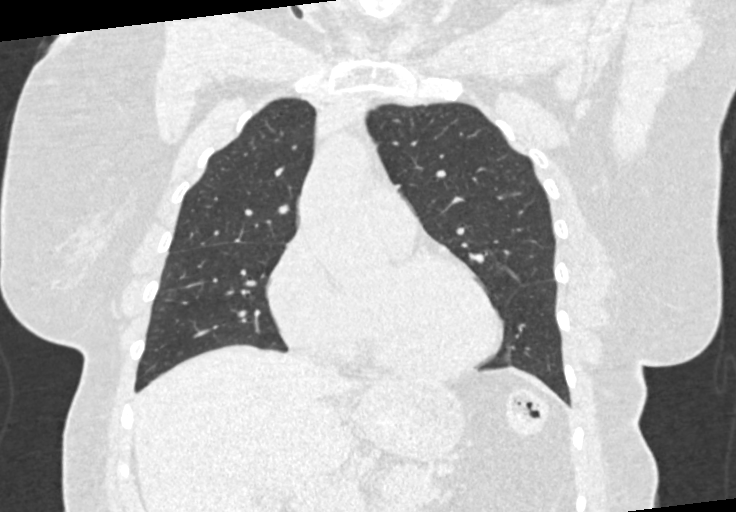

[Series 9: lung 1.00 br60 axial · axial · 0.76mm/px · z∈[-1102,-842]mm · 11 of 287 slices shown, 14 images]
[im 14/287  mediastinal]
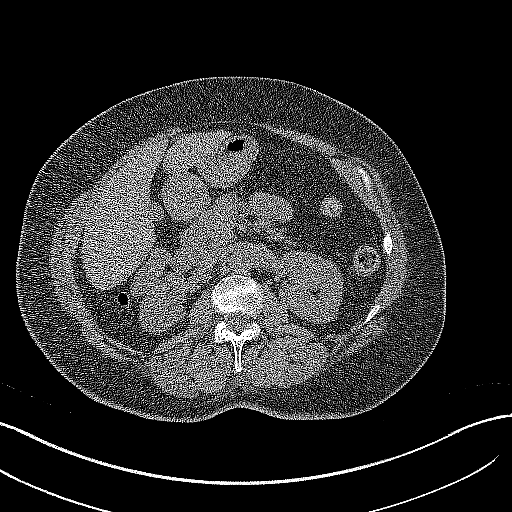
[im 14/287  lung]
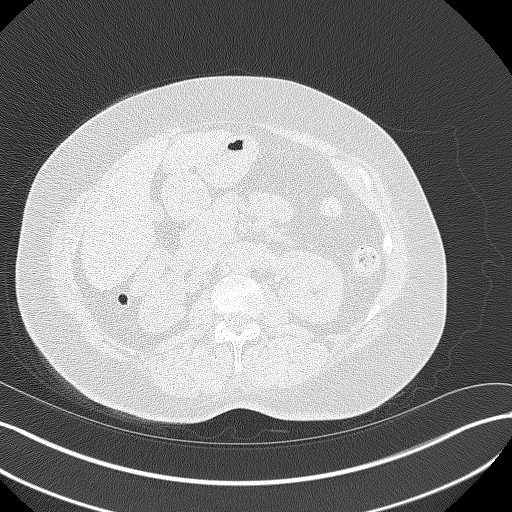
[im 40/287  lung]
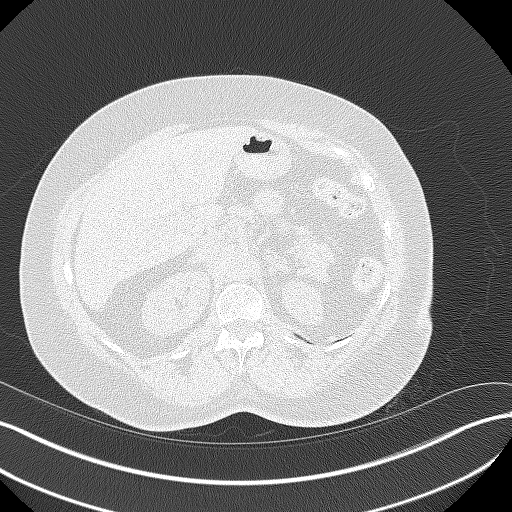
[im 66/287  lung]
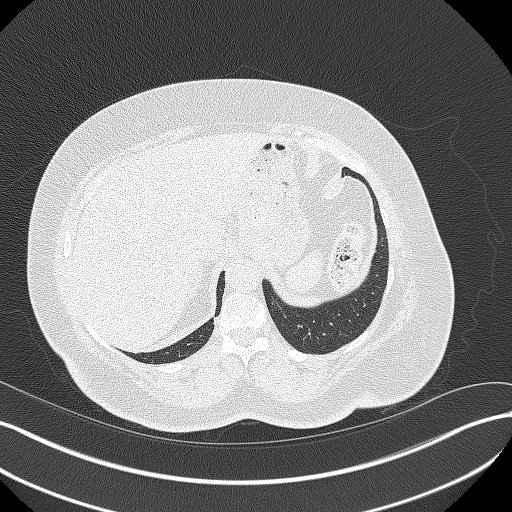
[im 92/287  lung]
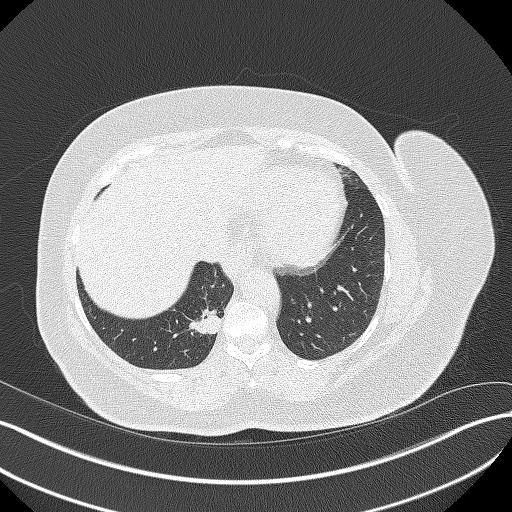
[im 118/287  mediastinal]
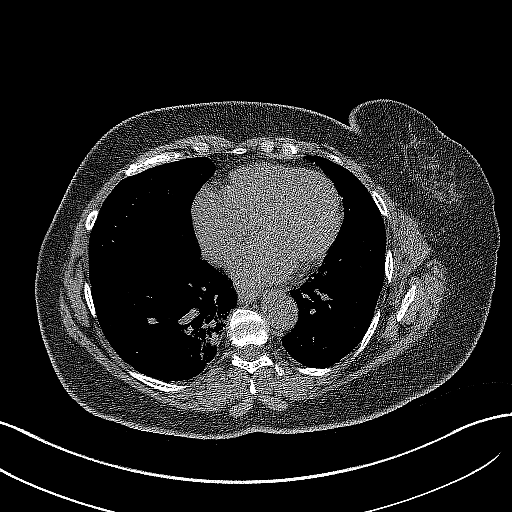
[im 118/287  lung]
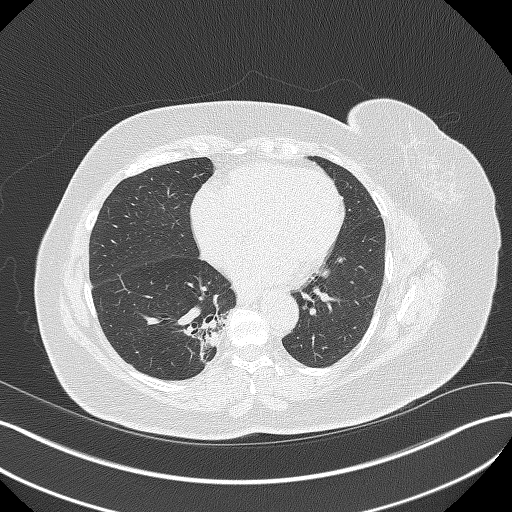
[im 144/287  lung]
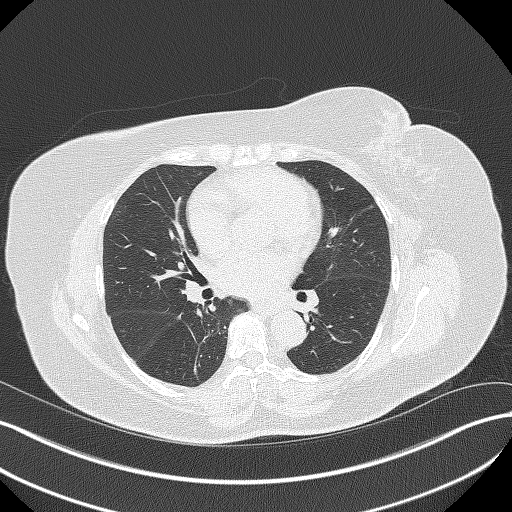
[im 170/287  lung]
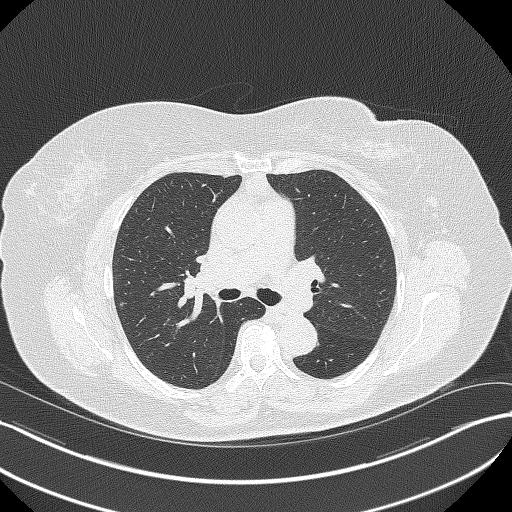
[im 196/287  lung]
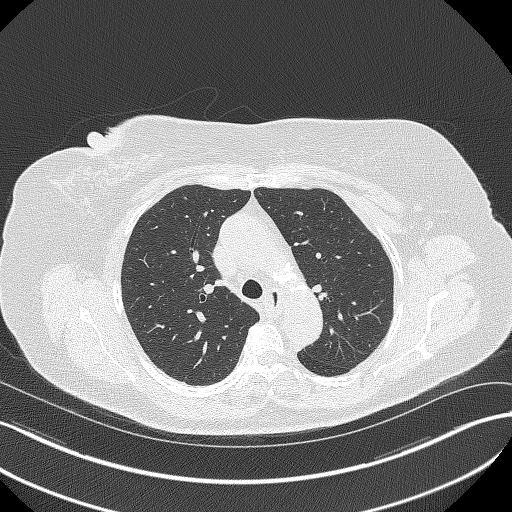
[im 222/287  mediastinal]
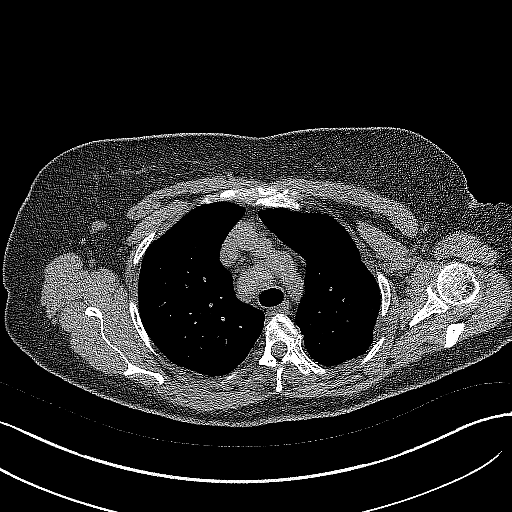
[im 222/287  lung]
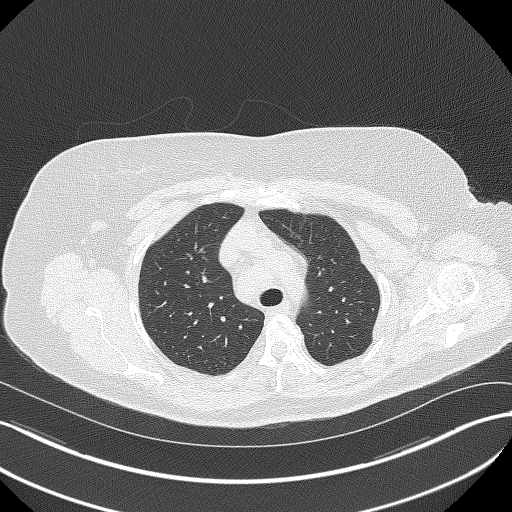
[im 248/287  lung]
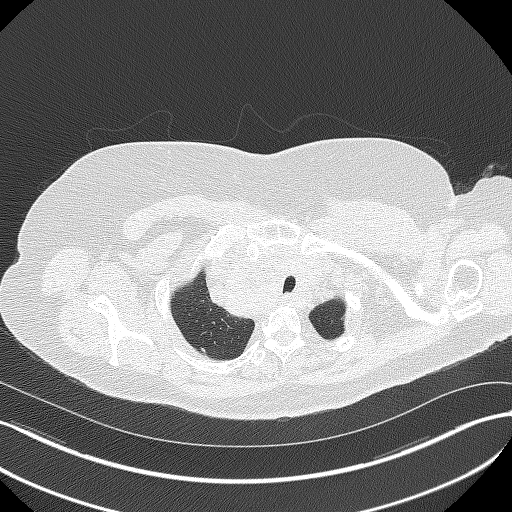
[im 274/287  lung]
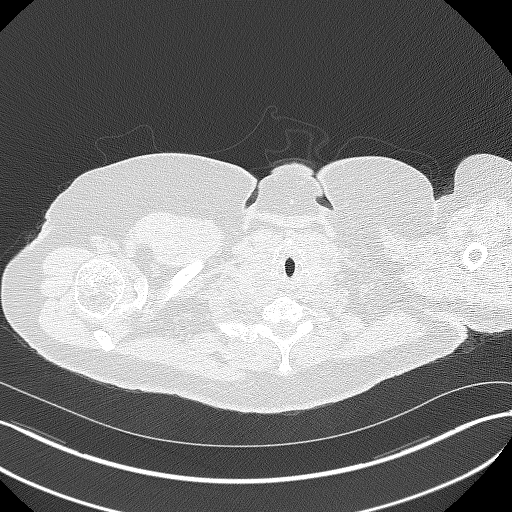

[14 of 40 positions shown; findings below may reference images not displayed]

FINDINGS: Cardiovascular: The heart size is normal. No substantial pericardial
effusion. Atherosclerotic calcification is noted in the wall of the
thoracic aorta.

Mediastinum/Nodes: 4.6 cm right thyroid lesion evident.1.6 cm short
axis nodule is identified in the anterior mediastinum ([DATE]). There
is no axillary lymphadenopathy. No evidence for gross hilar
lymphadenopathy although assessment is limited by the lack of
intravenous contrast on today's study. The esophagus has normal
imaging features.

Lungs/Pleura: Volume loss and bronchiectasis is associated with a
nodular opacity in the medial right lower lobe with volume derived
equivalent diameter of 19.3 mm. No other suspicious nodule or mass.
No pleural effusion.

Upper Abdomen: Unremarkable.

Musculoskeletal: No worrisome lytic or sclerotic osseous
abnormality.
IMPRESSION: 1. Bronchiectasis with volume loss including a 19.3 mm nodular
component in the medial right lower lobe. Technically, Lung-RADS 4B
by size criteria. This is likely an area of chronic atelectasis or
scarring, but consultation with Pulmonology or Thoracic Surgery may
be warranted. Follow up low-dose chest CT without contrast in 3
months (please use the following order, "CT CHEST LCS NODULE
FOLLOW-UP W/O CM") is recommended. Alternatively, PET may be
considered when there is a solid component 8mm or larger.
2.  Aortic Atherosclerois (1NS5M-170.0)

These results will be called to the ordering clinician or
representative by the Radiologist Assistant, and communication
documented in the PACS or zVision Dashboard.

## 2020-12-17 IMAGING — US US THYROID
1 series · 13 of 25 positions shown · non-contrast
Comparison: Chest CT-05/20/2019

CLINICAL DATA: Incidental on CT. Right-sided thyroid nodule
incidentally noted on chest CT

EXAM:
THYROID ULTRASOUND
TECHNIQUE: Ultrasound examination of the thyroid gland and adjacent soft
tissues was performed.

[Series 1: us thyroid · 0.07mm/px · 13 of 50 slices shown]
[im 1/50]
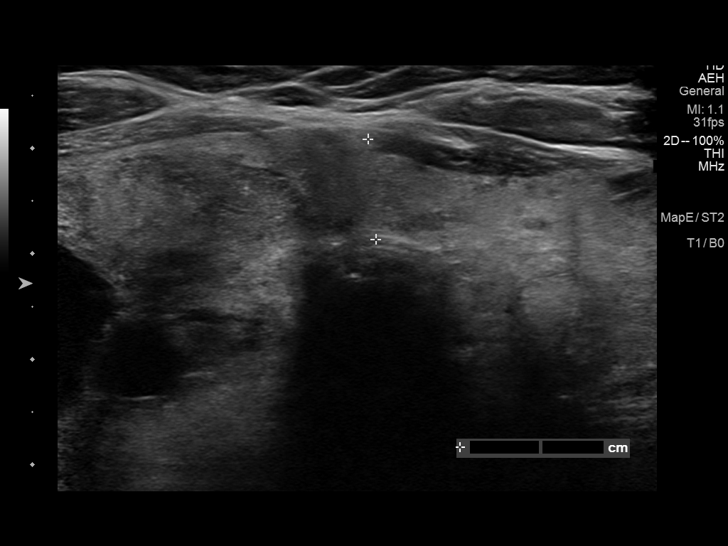
[im 5/50]
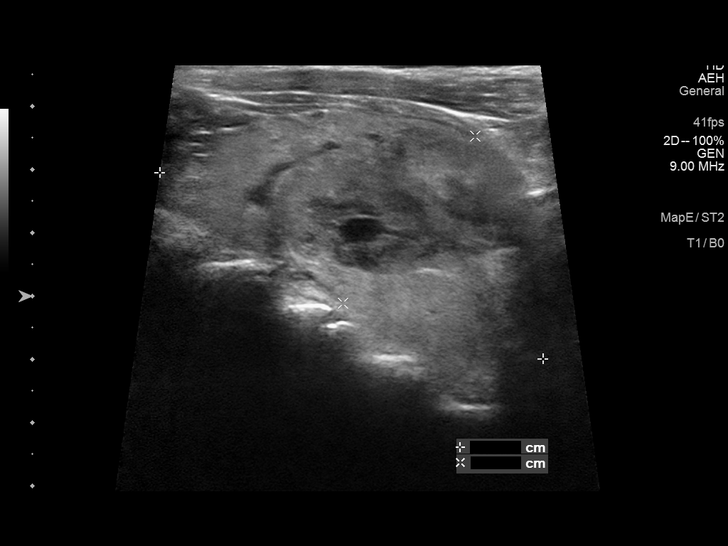
[im 9/50]
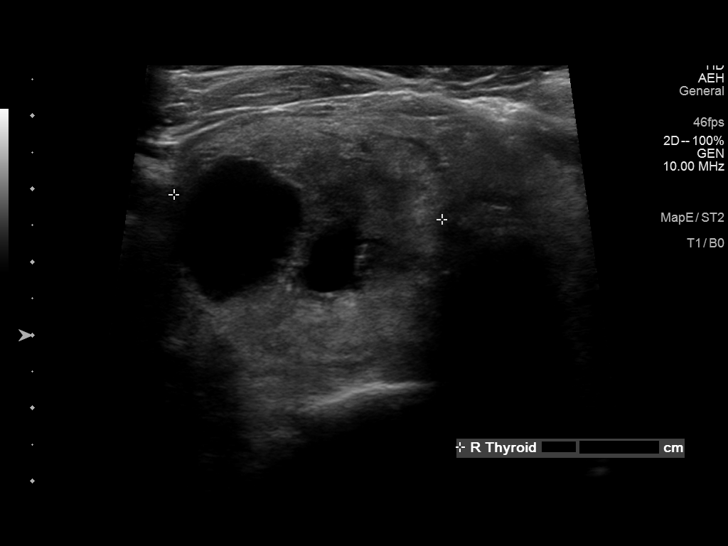
[im 13/50]
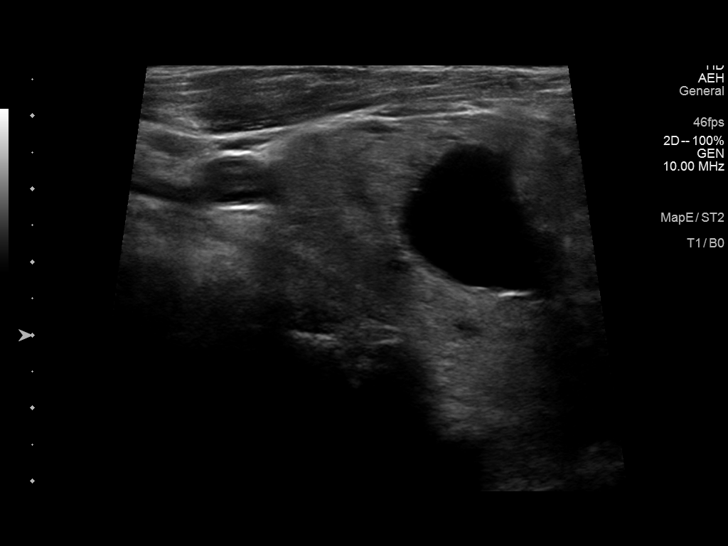
[im 17/50]
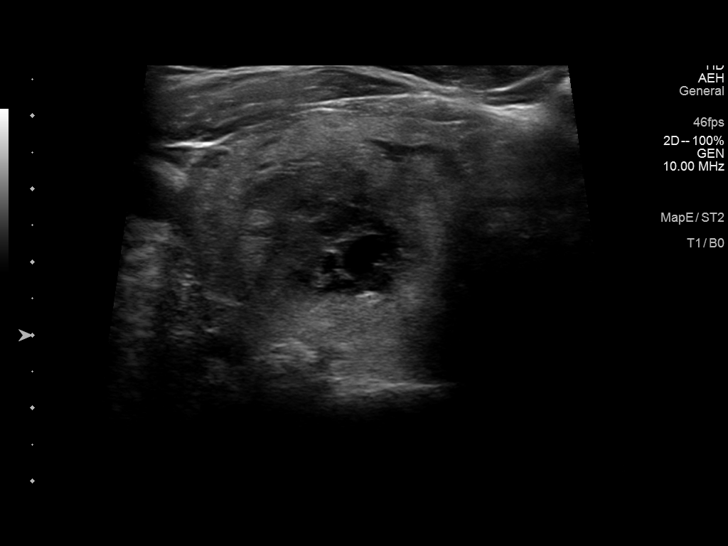
[im 21/50]
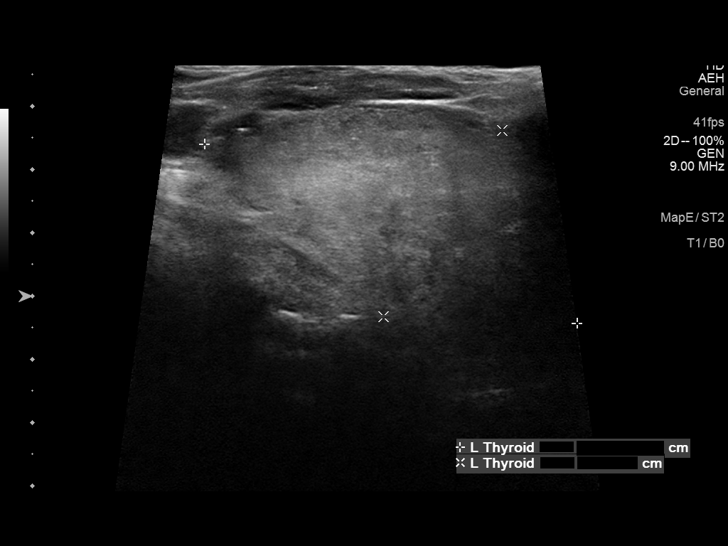
[im 25/50]
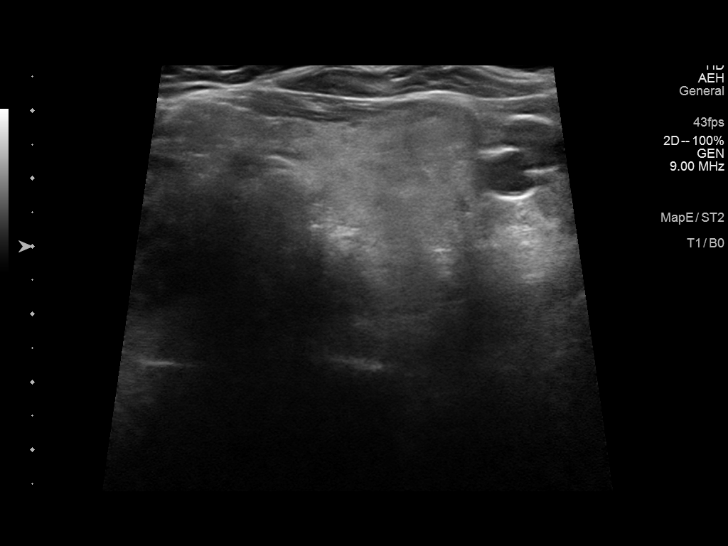
[im 29/50]
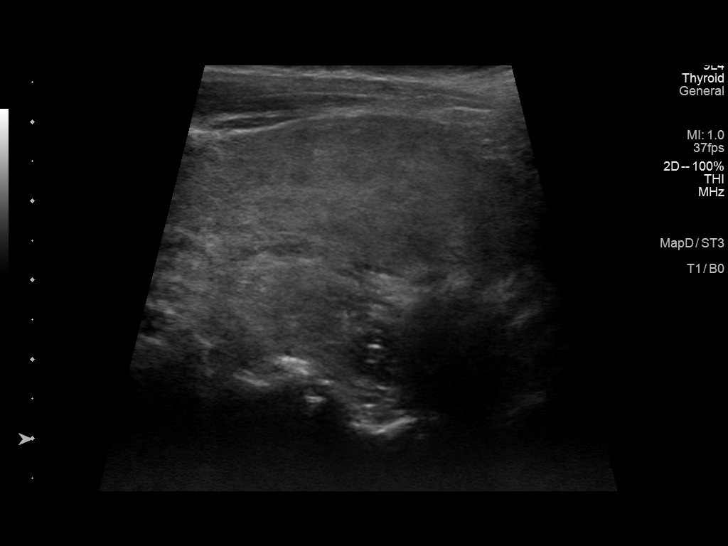
[im 33/50]
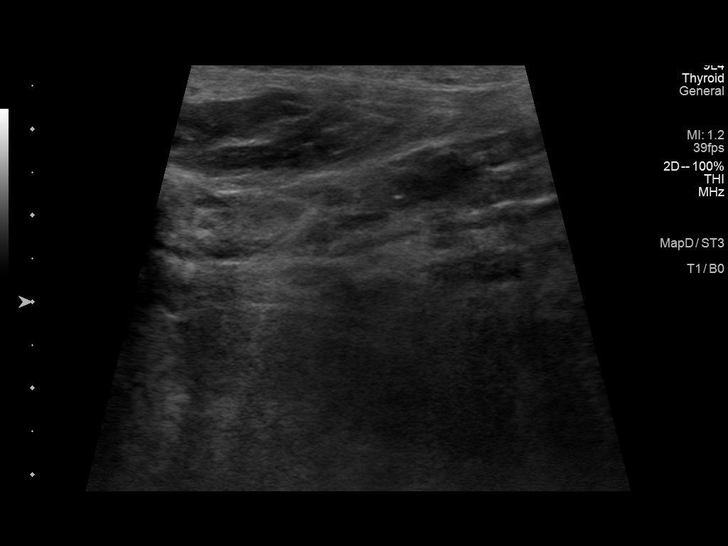
[im 37/50]
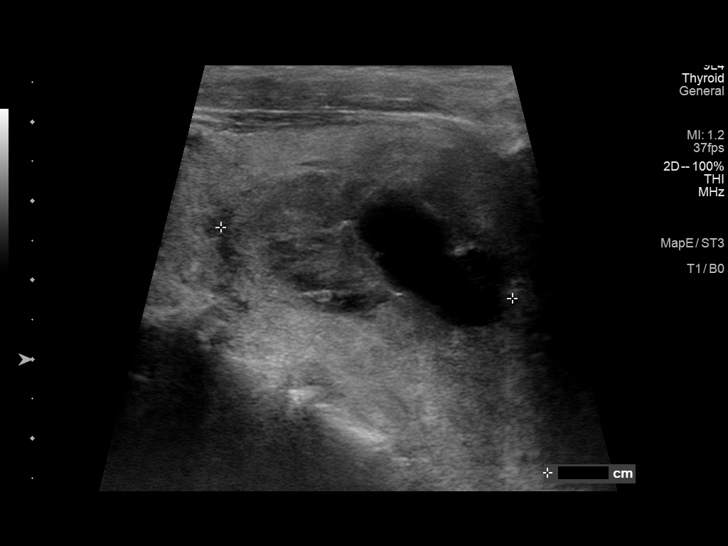
[im 41/50]
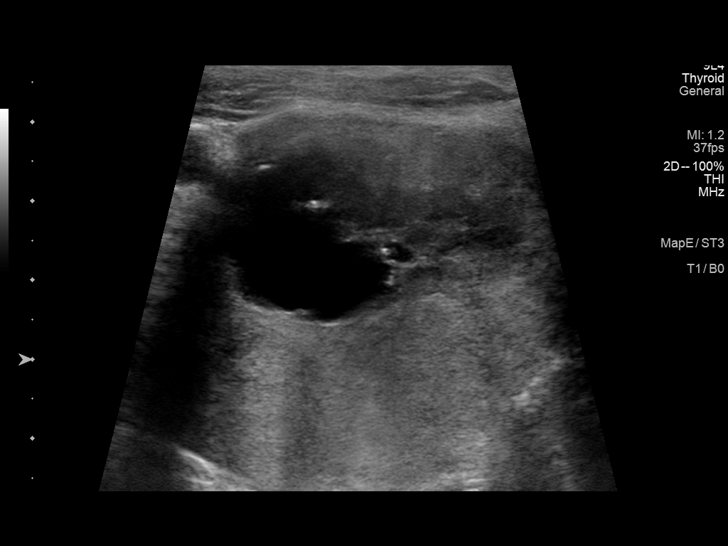
[im 45/50]
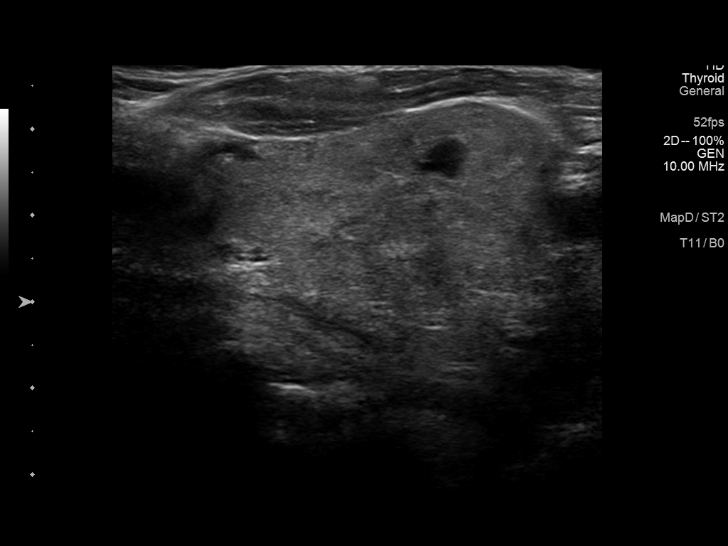
[im 50/50]
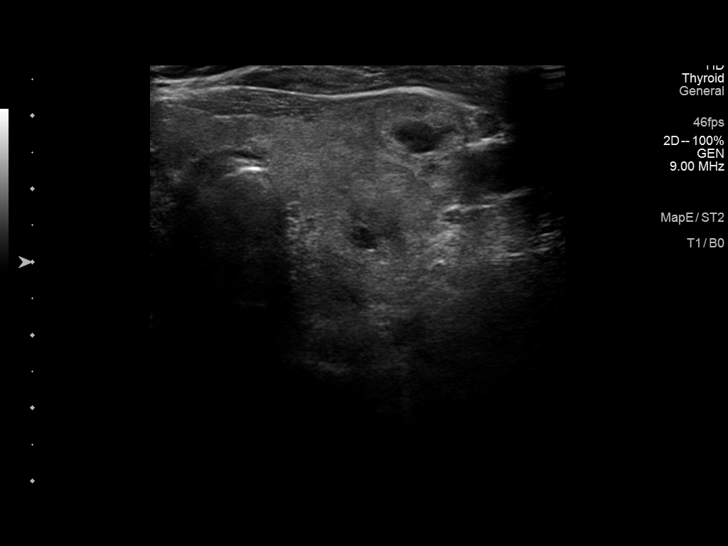

[13 of 25 positions shown; findings below may reference images not displayed]

FINDINGS: Parenchymal Echotexture: Mildly heterogenous

Isthmus: There is enlarged measuring 1 cm in diameter

Right lobe: Enlarged measuring 6.7 x 3.4 x 3.7 cm

Left lobe: Enlarged measuring 6.5 x 3.5 x 2.4 cm

_________________________________________________________

Estimated total number of nodules >/= 1 cm: 2

Number of spongiform nodules >/=  2 cm not described below (TR1): 0

Number of mixed cystic and solid nodules >/= 1.5 cm not described
below (TR2): 0

_________________________________________________________

Nodule # 1:

Location: Right; Mid - this nodule correlates with the nodule seen
on preceding chest CT

Maximum size: 3.9 cm; Other 2 dimensions: 3.9 x 3.8 cm

Composition: solid/almost completely solid (2)

Echogenicity: isoechoic (1)

Shape: not taller-than-wide (0)

Margins: ill-defined (0)

Echogenic foci: none (0)

ACR TI-RADS total points: 3.

ACR TI-RADS risk category: TR3 (3 points).

ACR TI-RADS recommendations:

**Given size (>/= 2.5 cm) and appearance, fine needle aspiration of
this mildly suspicious nodule should be considered based on TI-RADS
criteria.

_________________________________________________________

There is an approximately 1.4 x 1.2 x 0.9 cm partially cystic though
predominantly solid isoechoic nodule within the mid aspect the left
lobe of the thyroid (labeled 2), which does not meet imaging
criteria to recommend percutaneous sampling or continued dedicated
follow-up.
IMPRESSION: 1. Findings suggestive multinodular goiter.
2. Nodule labeled #1, correlating with the nodule seen on preceding
chest CT, meets imaging criteria to recommend percutaneous sampling
as clinically indicated.

The above is in keeping with the ACR TI-RADS recommendations - [HOSPITAL] 5840;[DATE].

## 2020-12-30 ENCOUNTER — Other Ambulatory Visit: Payer: Self-pay | Admitting: Internal Medicine

## 2021-01-27 ENCOUNTER — Ambulatory Visit: Payer: Medicare Other | Admitting: Internal Medicine

## 2021-02-10 ENCOUNTER — Encounter: Payer: Medicare Other | Admitting: Internal Medicine

## 2021-02-24 ENCOUNTER — Other Ambulatory Visit: Payer: Self-pay

## 2021-02-24 ENCOUNTER — Ambulatory Visit (INDEPENDENT_AMBULATORY_CARE_PROVIDER_SITE_OTHER): Payer: Medicare Other | Admitting: Nurse Practitioner

## 2021-02-24 VITALS — BP 138/74 | HR 76 | Temp 98.2°F | Ht <= 58 in | Wt 149.0 lb

## 2021-02-24 DIAGNOSIS — Z6832 Body mass index (BMI) 32.0-32.9, adult: Secondary | ICD-10-CM

## 2021-02-24 DIAGNOSIS — I129 Hypertensive chronic kidney disease with stage 1 through stage 4 chronic kidney disease, or unspecified chronic kidney disease: Secondary | ICD-10-CM

## 2021-02-24 DIAGNOSIS — Z0000 Encounter for general adult medical examination without abnormal findings: Secondary | ICD-10-CM | POA: Diagnosis not present

## 2021-02-24 DIAGNOSIS — E559 Vitamin D deficiency, unspecified: Secondary | ICD-10-CM

## 2021-02-24 DIAGNOSIS — E1165 Type 2 diabetes mellitus with hyperglycemia: Secondary | ICD-10-CM | POA: Diagnosis not present

## 2021-02-24 DIAGNOSIS — N182 Chronic kidney disease, stage 2 (mild): Secondary | ICD-10-CM | POA: Diagnosis not present

## 2021-02-24 DIAGNOSIS — E6609 Other obesity due to excess calories: Secondary | ICD-10-CM

## 2021-02-24 DIAGNOSIS — IMO0002 Reserved for concepts with insufficient information to code with codable children: Secondary | ICD-10-CM

## 2021-02-24 DIAGNOSIS — E041 Nontoxic single thyroid nodule: Secondary | ICD-10-CM

## 2021-02-24 DIAGNOSIS — E1122 Type 2 diabetes mellitus with diabetic chronic kidney disease: Secondary | ICD-10-CM | POA: Diagnosis not present

## 2021-02-24 LAB — POCT UA - MICROALBUMIN
Albumin/Creatinine Ratio, Urine, POC: <30
Creatinine, POC: 200 mg/dL
Microalbumin Ur, POC: 30 mg/L

## 2021-02-24 LAB — POCT URINALYSIS DIPSTICK
Bilirubin, UA: NEGATIVE
Blood, UA: NEGATIVE
Glucose, UA: NEGATIVE
Ketones, UA: NEGATIVE
Leukocytes, UA: NEGATIVE
Nitrite, UA: NEGATIVE
Protein, UA: NEGATIVE
Spec Grav, UA: 1.02 (ref 1.010–1.025)
Urobilinogen, UA: 0.2 E.U./dL
pH, UA: 5.5 (ref 5.0–8.0)

## 2021-02-24 NOTE — Patient Instructions (Signed)
Health Maintenance, Female Adopting a healthy lifestyle and getting preventive care are important in promoting health and wellness. Ask your health care provider about:  The right schedule for you to have regular tests and exams.  Things you can do on your own to prevent diseases and keep yourself healthy. What should I know about diet, weight, and exercise? Eat a healthy diet  Eat a diet that includes plenty of vegetables, fruits, low-fat dairy products, and lean protein.  Do not eat a lot of foods that are high in solid fats, added sugars, or sodium.   Maintain a healthy weight Body mass index (BMI) is used to identify weight problems. It estimates body fat based on height and weight. Your health care provider can help determine your BMI and help you achieve or maintain a healthy weight. Get regular exercise Get regular exercise. This is one of the most important things you can do for your health. Most adults should:  Exercise for at least 150 minutes each week. The exercise should increase your heart rate and make you sweat (moderate-intensity exercise).  Do strengthening exercises at least twice a week. This is in addition to the moderate-intensity exercise.  Spend less time sitting. Even light physical activity can be beneficial. Watch cholesterol and blood lipids Have your blood tested for lipids and cholesterol at 74 years of age, then have this test every 5 years. Have your cholesterol levels checked more often if:  Your lipid or cholesterol levels are high.  You are older than 74 years of age.  You are at high risk for heart disease. What should I know about cancer screening? Depending on your health history and family history, you may need to have cancer screening at various ages. This may include screening for:  Breast cancer.  Cervical cancer.  Colorectal cancer.  Skin cancer.  Lung cancer. What should I know about heart disease, diabetes, and high blood  pressure? Blood pressure and heart disease  High blood pressure causes heart disease and increases the risk of stroke. This is more likely to develop in people who have high blood pressure readings, are of African descent, or are overweight.  Have your blood pressure checked: ? Every 3-5 years if you are 18-39 years of age. ? Every year if you are 40 years old or older. Diabetes Have regular diabetes screenings. This checks your fasting blood sugar level. Have the screening done:  Once every three years after age 40 if you are at a normal weight and have a low risk for diabetes.  More often and at a younger age if you are overweight or have a high risk for diabetes. What should I know about preventing infection? Hepatitis B If you have a higher risk for hepatitis B, you should be screened for this virus. Talk with your health care provider to find out if you are at risk for hepatitis B infection. Hepatitis C Testing is recommended for:  Everyone born from 1945 through 1965.  Anyone with known risk factors for hepatitis C. Sexually transmitted infections (STIs)  Get screened for STIs, including gonorrhea and chlamydia, if: ? You are sexually active and are younger than 74 years of age. ? You are older than 74 years of age and your health care provider tells you that you are at risk for this type of infection. ? Your sexual activity has changed since you were last screened, and you are at increased risk for chlamydia or gonorrhea. Ask your health care provider   if you are at risk.  Ask your health care provider about whether you are at high risk for HIV. Your health care provider may recommend a prescription medicine to help prevent HIV infection. If you choose to take medicine to prevent HIV, you should first get tested for HIV. You should then be tested every 3 months for as long as you are taking the medicine. Pregnancy  If you are about to stop having your period (premenopausal) and  you may become pregnant, seek counseling before you get pregnant.  Take 400 to 800 micrograms (mcg) of folic acid every day if you become pregnant.  Ask for birth control (contraception) if you want to prevent pregnancy. Osteoporosis and menopause Osteoporosis is a disease in which the bones lose minerals and strength with aging. This can result in bone fractures. If you are 65 years old or older, or if you are at risk for osteoporosis and fractures, ask your health care provider if you should:  Be screened for bone loss.  Take a calcium or vitamin D supplement to lower your risk of fractures.  Be given hormone replacement therapy (HRT) to treat symptoms of menopause. Follow these instructions at home: Lifestyle  Do not use any products that contain nicotine or tobacco, such as cigarettes, e-cigarettes, and chewing tobacco. If you need help quitting, ask your health care provider.  Do not use street drugs.  Do not share needles.  Ask your health care provider for help if you need support or information about quitting drugs. Alcohol use  Do not drink alcohol if: ? Your health care provider tells you not to drink. ? You are pregnant, may be pregnant, or are planning to become pregnant.  If you drink alcohol: ? Limit how much you use to 0-1 drink a day. ? Limit intake if you are breastfeeding.  Be aware of how much alcohol is in your drink. In the [place] one drink equals one 12 oz bottle of beer (355 mL), one 5 oz glass of wine (148 mL), or one 1 oz glass of hard liquor (44 mL). General instructions  Schedule regular health, dental, and eye exams.  Stay current with your vaccines.  Tell your health care provider if: ? You often feel depressed. ? You have ever been abused or do not feel safe at home. Summary  Adopting a healthy lifestyle and getting preventive care are important in promoting health and wellness.  Follow your health care provider's instructions about healthy  diet, exercising, and getting tested or screened for diseases.  Follow your health care provider's instructions on monitoring your cholesterol and blood pressure. This information is not intended to replace advice given to you by your health care provider. Make sure you discuss any questions you have with your health care provider. Document Revised: 10/17/2018 Document Reviewed: 10/17/2018 Elsevier Patient Education  2021 [employer] Inc.  

## 2021-02-24 NOTE — Progress Notes (Signed)
I,Tianna Badgett,acting as a Education administrator for [employer], NP.,have documented all relevant documentation on the behalf of [employer], NP,as directed by  Bary Castilla, NP while in the presence of Bary Castilla, NP.  This visit occurred during the SARS-CoV-2 public health emergency.  Safety protocols were in place, including screening questions prior to the visit, additional usage of staff PPE, and extensive cleaning of exam room while observing appropriate contact time as indicated for disinfecting solutions.  Subjective:     Patient ID: Anita Carpenter , female    DOB: 01-30-1946 , 75 y.o.   MRN: [id number]   Chief Complaint  Patient presents with  . Annual Exam    HPI  She is here today for a full physical examination. She is no longer followed by GYN. Does not wish to have pelvic exam today. She has no specific concerns or complaints at this time. She is taking all of the medication as prescribed.  She had her eye exam in oct of 2021. She had covid in Aug of 2021. She received infusion. She was vaccinated with first 2 shots but was not booster shot. She has been alright with it. She got the booster shot in Nov. But feels like she has had more sinus problems. No shortness of breath.   Hypertension This is a chronic problem. The current episode started more than 1 year ago. The problem has been gradually improving since onset. The problem is controlled. Pertinent negatives include no blurred vision, chest pain, headaches, palpitations or shortness of breath. Risk factors for coronary artery disease include sedentary lifestyle and obesity. Past treatments include angiotensin blockers and diuretics. The current treatment provides moderate improvement.  Diabetes She presents for her follow-up diabetic visit. She has type 2 diabetes mellitus. There are no hypoglycemic associated symptoms. Pertinent negatives for hypoglycemia include no headaches. Pertinent negatives for diabetes  include no blurred vision, no chest pain and no weakness. There are no hypoglycemic complications. Risk factors for coronary artery disease include diabetes mellitus, dyslipidemia, obesity, sedentary lifestyle and post-menopausal.     Past Medical History:  Diagnosis Date  . Asthma   . Diabetes mellitus without complication ([place])   . High cholesterol   . Hypertension      Family History  Problem Relation Age of Onset  . Asthma Other   . Diabetes Other   . Hyperlipidemia Other   . Hyperlipidemia Mother   . Hypertension Mother   . Kidney failure Father   . Stroke Father      Current Outpatient Medications:  .  amLODipine (NORVASC) 5 MG tablet, TAKE 1 TABLET BY MOUTH EVERY DAY, Disp: 90 tablet, Rfl: 1 .  Cholecalciferol (VITAMIN D3) 5000 UNITS CAPS, Take 5,000 Units by mouth every other day. , Disp: , Rfl:  .  ferrous sulfate 325 (65 FE) MG tablet, Take 1 tablet (325 mg total) by mouth daily with breakfast., Disp: 30 tablet, Rfl: 3 .  glucose blood (ONETOUCH VERIO) test strip, 1 each by Other route daily. Use as instructed, Disp: , Rfl:  .  hydrALAZINE (APRESOLINE) 25 MG tablet, TAKE 1 TABLET BY MOUTH EVERY DAY, Disp: 90 tablet, Rfl: 1 .  metoprolol tartrate (LOPRESSOR) 25 MG tablet, TAKE 1 TABLET BY MOUTH EVERY DAY, Disp: 90 tablet, Rfl: 1 .  olmesartan-hydrochlorothiazide (BENICAR HCT) 40-25 MG tablet, Take 1 tablet by mouth daily., Disp: , Rfl:  .  rosuvastatin (CRESTOR) 10 MG tablet, TAKE 1 TABLET BY MOUTH EVERY DAY, Disp: 90 tablet, Rfl: 1  Allergies  Allergen Reactions  . Doxycycline Rash      The patient states she usesfor birth control. Last LMP was No LMP recorded. Patient is postmenopausal. Negative for: breast discharge, breast lump(s), breast pain and breast self exam. Associated symptoms include abnormal vaginal bleeding. Pertinent negatives include abnormal bleeding (hematology), anxiety, decreased libido, depression, difficulty falling sleep, dyspareunia, history  of infertility, nocturia, sexual dysfunction, sleep disturbances, urinary incontinence, urinary urgency, vaginal discharge and vaginal itching. Diet regular.The patient states her exercise level is    . The patient's tobacco use is:  Social History   Tobacco Use  Smoking Status Former Smoker  . Packs/day: 0.25  . Years: 50.00  . Pack years: 12.50  . Types: Cigarettes  . Start date: 60  . Quit date: 12/03/2018  . Years since quitting: 2.2  Smokeless Tobacco Never Used  Tobacco Comment   1ppdx  . She has been exposed to passive smoke. The patient's alcohol use is:  Social History   Substance and Sexual Activity  Alcohol Use Not Currently  . Additional information:   Review of Systems  Constitutional: Negative.  Negative for chills and fever.  HENT: Negative for congestion, ear pain, sinus pain and sore throat.   Eyes: Negative.  Negative for blurred vision.  Respiratory: Negative.  Negative for apnea, cough, chest tightness, shortness of breath and wheezing.   Cardiovascular: Negative.  Negative for chest pain and palpitations.  Gastrointestinal: Negative.  Negative for diarrhea, nausea and vomiting.  Endocrine: Negative.   Genitourinary: Negative for difficulty urinating and dysuria.  Musculoskeletal: Negative.  Negative for back pain and myalgias.  Skin: Negative.   Allergic/Immunologic: Negative.   Neurological: Negative.  Negative for weakness, numbness and headaches.  Hematological: Negative.   Psychiatric/Behavioral: Negative.      Today's Vitals   02/24/21 1004  BP: 138/74  Pulse: 76  Temp: 98.2 F (36.8 C)  TempSrc: Oral  Weight: 149 lb (67.6 kg)  Height: 4' 9.2" (1.453 m)   Body mass index is 32.02 kg/m.  Wt Readings from Last 3 Encounters:  02/24/21 149 lb (67.6 kg)  08/18/20 149 lb (67.6 kg)  01/30/20 156 lb 9.6 oz (71 kg)    Objective:  Physical Exam Vitals and nursing note reviewed.  Constitutional:      Appearance: Normal appearance.  HENT:      Head: Normocephalic and atraumatic.     Right Ear: Tympanic membrane, ear canal and external ear normal.     Left Ear: Tympanic membrane, ear canal and external ear normal.     Nose: Nose normal.     Mouth/Throat:     Mouth: Mucous membranes are moist.     Pharynx: Oropharynx is clear.  Eyes:     Extraocular Movements: Extraocular movements intact.     Conjunctiva/sclera: Conjunctivae normal.     Pupils: Pupils are equal, round, and reactive to light.  Neck:     Thyroid: Thyromegaly present.  Cardiovascular:     Rate and Rhythm: Normal rate and regular rhythm.     Pulses: Normal pulses.     Heart sounds: Normal heart sounds.  Pulmonary:     Effort: Pulmonary effort is normal. No respiratory distress.     Breath sounds: Normal breath sounds.  Abdominal:     General: Abdomen is flat. Bowel sounds are normal.     Palpations: Abdomen is soft.     Tenderness: There is no abdominal tenderness.  Genitourinary:    Comments: deferred Musculoskeletal:  General: Normal range of motion.     Cervical back: Normal range of motion and neck supple.  Feet:     Right foot:     Protective Sensation: 5 sites tested. 5 sites sensed.     Skin integrity: Skin integrity normal.     Toenail Condition: Right toenails are normal.     Left foot:     Protective Sensation: 5 sites tested. 5 sites sensed.     Skin integrity: Skin integrity normal.     Toenail Condition: Left toenails are normal.  Skin:    General: Skin is warm and dry.     Capillary Refill: Capillary refill takes less than 2 seconds.  Neurological:     General: No focal deficit present.     Mental Status: She is alert and oriented to person, place, and time.  Psychiatric:        Mood and Affect: Mood normal.        Behavior: Behavior normal.        Thought Content: Thought content normal.        Judgment: Judgment normal.      Assessment And Plan:     1. Routine general medical examination at health care  facility --Patient is here for their annual physical exam and we discussed any changes to medication and medical history.  -Behavior modification was discussed as well as diet and exercise history  -Patient will continue to exercise regularly and modify their diet.  -Recommendation for yearly physical annuals, immunization and screenings including mammogram and colonoscopy were discussed with the patient.  -Recommended intake of multivitamin, vitamin D and calcium.  -Individualized advise was given to the patient pertaining to their own health history in regards to diet, exercise, medical condition and referrals.  - Hepatitis C antibody  2. 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  -Discussed with patient the importance of glycemic control and long term complications from uncontrolled diabetes. Discussed with the patient the importance of compliance with home glucose monitoring, diet which includes decrease amount of sugary drinks and foods. Importance of exercise was also discussed with the patient. Importance of eye exams, self foot care and compliance to office visits was also discussed with the patient.  - CBC - Hemoglobin A1c - CMP14+EGFR - Lipid panel  3. Hypertensive nephropathy -Chronic, fair control -Continue meds  Limit the intake of processed foods and salt intake. You should increase your intake of green vegetables and fruits. Limit the use of alcohol. Limit fast foods and fried foods. Avoid high fatty saturated and trans fat foods. Keep yourself hydrated with drinking water. Avoid red meats. Eat lean meats instead. Exercise for atleast 30-45 min for atleast 4-5 times a week. - POCT Urinalysis Dipstick (81002) - POCT UA - Microalbumin - EKG 12-Lead- NSR  - CBC - Hemoglobin A1c - CMP14+EGFR - Lipid panel  4. Vitamin D deficiency -will check vitamin D and supplement as needed.  - VITAMIN D 25 Hydroxy  (Vit-D Deficiency, Fractures)  5. Thyroid nodule -Will do a repeat US from 2020  -TSH  - US THYROID; Future  6. Class 1 obesity due to excess calories with serious comorbidity and body mass index (BMI) of 32.0 to 32.9 in adult -Exercise atleast 30-45 min. Daily  -She is encouraged to strive for BMI less than 30 to decrease cardiac risk. Advised to aim for at least 150 minutes of exercise per week.   Staying  healthy and adopting a healthy lifestyle for your overall health is important. You should eat 7 or more servings of fruits and vegetables per day. You should drink plenty of water to keep yourself hydrated and your kidneys healthy. This includes about 65-80+ fluid ounces of water. Limit your intake of animal fats especially for elevated cholesterol. Avoid highly processed food and limit your salt intake if you have hypertension. Avoid foods high in saturated/Trans fats. Along with a healthy diet it is also very important to maintain time for yourself to maintain a healthy mental health with low stress levels. You should get atleast 150 min of moderate intensity exercise weekly for a healthy heart. Along with eating right and exercising, aim for at least 7-9 hours of sleep daily.  Eat more whole grains which includes barley, wheat berries, oats, brown rice and whole wheat pasta. Use healthy plant oils which include olive, soy, corn, sunflower and peanut. Limit your caffeine and sugary drinks. Limit your intake of fast foods. Limit milk and dairy products to one or two daily servings.   Patient was given opportunity to ask questions. Patient verbalized understanding of the plan and was able to repeat key elements of the plan. All questions were answered to their satisfaction.  Bary Castilla, DNP   I, Bary Castilla, DNP  have reviewed all documentation for this visit. The documentation on 02/24/21  for the exam, diagnosis, procedures, and orders are all accurate and complete.   THE PATIENT  IS ENCOURAGED TO PRACTICE SOCIAL DISTANCING DUE TO THE COVID-19 PANDEMIC.

## 2021-02-25 LAB — HEMOGLOBIN A1C
Est. average glucose Bld gHb Est-mCnc: 134 mg/dL
Hgb A1c MFr Bld: 6.3 % — ABNORMAL HIGH (ref 4.8–5.6)

## 2021-02-25 LAB — CBC
Hematocrit: 39.1 % (ref 34.0–46.6)
Hemoglobin: 11.6 g/dL (ref 11.1–15.9)
MCH: 22.7 pg — ABNORMAL LOW (ref 26.6–33.0)
MCHC: 29.7 g/dL — ABNORMAL LOW (ref 31.5–35.7)
MCV: 76 fL — ABNORMAL LOW (ref 79–97)
Platelets: 339 10*3/uL (ref 150–450)
RBC: 5.12 x10E6/uL (ref 3.77–5.28)
RDW: 14.2 % (ref 11.7–15.4)
WBC: 5.4 10*3/uL (ref 3.4–10.8)

## 2021-02-25 LAB — CMP14+EGFR
ALT: 14 IU/L (ref 0–32)
AST: 19 IU/L (ref 0–40)
Albumin/Globulin Ratio: 1.8 (ref 1.2–2.2)
Albumin: 4.8 g/dL — ABNORMAL HIGH (ref 3.7–4.7)
Alkaline Phosphatase: 89 IU/L (ref 44–121)
BUN/Creatinine Ratio: 20 (ref 12–28)
BUN: 18 mg/dL (ref 8–27)
Bilirubin Total: 0.6 mg/dL (ref 0.0–1.2)
CO2: 24 mmol/L (ref 20–29)
Calcium: 10.3 mg/dL (ref 8.7–10.3)
Chloride: 104 mmol/L (ref 96–106)
Creatinine, Ser: 0.91 mg/dL (ref 0.57–1.00)
Globulin, Total: 2.6 g/dL (ref 1.5–4.5)
Glucose: 107 mg/dL — ABNORMAL HIGH (ref 65–99)
Potassium: 4.9 mmol/L (ref 3.5–5.2)
Sodium: 144 mmol/L (ref 134–144)
Total Protein: 7.4 g/dL (ref 6.0–8.5)
eGFR: 67 mL/min/{1.73_m2} (ref >59)

## 2021-02-25 LAB — LIPID PANEL
Chol/HDL Ratio: 2.2 ratio (ref 0.0–4.4)
Cholesterol, Total: 163 mg/dL (ref 100–199)
HDL: 74 mg/dL (ref >39)
LDL Chol Calc (NIH): 74 mg/dL (ref 0–99)
Triglycerides: 81 mg/dL (ref 0–149)
VLDL Cholesterol Cal: 15 mg/dL (ref 5–40)

## 2021-02-25 LAB — VITAMIN D 25 HYDROXY (VIT D DEFICIENCY, FRACTURES): Vit D, 25-Hydroxy: 27.1 ng/mL — ABNORMAL LOW (ref 30.0–100.0)

## 2021-02-25 LAB — HEPATITIS C ANTIBODY: Hep C Virus Ab: <0.1 s/co ratio (ref 0.0–0.9)

## 2021-02-26 LAB — SPECIMEN STATUS REPORT

## 2021-02-26 LAB — TSH: TSH: 1.36 u[IU]/mL (ref 0.450–4.500)

## 2021-03-02 ENCOUNTER — Other Ambulatory Visit: Payer: Self-pay | Admitting: Nurse Practitioner

## 2021-03-02 DIAGNOSIS — E559 Vitamin D deficiency, unspecified: Secondary | ICD-10-CM

## 2021-03-02 MED ORDER — VITAMIN D (ERGOCALCIFEROL) 1.25 MG (50000 UNIT) PO CAPS: 50000.0000 [IU] | ORAL_CAPSULE | ORAL | 0 refills | Status: DC

## 2021-03-05 ENCOUNTER — Telehealth: Payer: Self-pay

## 2021-03-05 NOTE — Telephone Encounter (Signed)
-----   Message from Charlesetta Ivory, NP sent at 03/02/2021  4:31 PM EDT ----- Take the once weekly vitamin D instead of the 5000 daily.

## 2021-03-05 NOTE — Telephone Encounter (Signed)
Left a detailed message.

## 2021-03-15 ENCOUNTER — Ambulatory Visit
Admission: RE | Admit: 2021-03-15 | Discharge: 2021-03-15 | Disposition: A | Discharge status: Home / Self Care | Payer: Medicare Other | Source: Ambulatory Visit | Attending: Nurse Practitioner | Admitting: Nurse Practitioner

## 2021-03-15 DIAGNOSIS — E041 Nontoxic single thyroid nodule: Secondary | ICD-10-CM

## 2021-03-18 NOTE — Progress Notes (Signed)
Thank you. Reviewed it.

## 2021-03-25 ENCOUNTER — Ambulatory Visit (INDEPENDENT_AMBULATORY_CARE_PROVIDER_SITE_OTHER): Payer: Medicare Other

## 2021-03-25 VITALS — Ht <= 58 in | Wt 145.0 lb

## 2021-03-25 DIAGNOSIS — Z0000 Encounter for general adult medical examination without abnormal findings: Secondary | ICD-10-CM | POA: Diagnosis not present

## 2021-03-25 NOTE — Patient Instructions (Signed)
Anita Carpenter , Thank you for taking time to come for your Medicare Wellness Visit. I appreciate your ongoing commitment to your health goals. Please review the following plan we discussed and let me know if I can assist you in the future.   Screening recommendations/referrals: Colonoscopy: completed 08/18/2017 Mammogram: completed 08/31/2020 Bone Density: completed 11/02/2016 Recommended yearly ophthalmology/optometry visit for glaucoma screening and checkup Recommended yearly dental visit for hygiene and checkup  Vaccinations: Influenza vaccine: completed 08/18/2020, due 06/07/2021 Pneumococcal vaccine: due Tdap vaccine: completed 08/05/2013 Shingles vaccine: discussed   Covid-19: 09/19/2020, 01/15/2020, 12/21/2019  Advanced directives: Advance directive discussed with you today.  Conditions/risks identified: none  Next appointment: Follow up in one year for your annual wellness visit    Preventive Care 65 Years and Older, Female Preventive care refers to lifestyle choices and visits with your health care provider that can promote health and wellness. What does preventive care include?  A yearly physical exam. This is also called an annual well check.  Dental exams once or twice a year.  Routine eye exams. Ask your health care provider how often you should have your eyes checked.  Personal lifestyle choices, including:  Daily care of your teeth and gums.  Regular physical activity.  Eating a healthy diet.  Avoiding tobacco and drug use.  Limiting alcohol use.  Practicing safe sex.  Taking low-dose aspirin every day.  Taking vitamin and mineral supplements as recommended by your health care provider. What happens during an annual well check? The services and screenings done by your health care provider during your annual well check will depend on your age, overall health, lifestyle risk factors, and family history of disease. Counseling  Your health care provider may ask  you questions about your:  Alcohol use.  Tobacco use.  Drug use.  Emotional well-being.  Home and relationship well-being.  Sexual activity.  Eating habits.  History of falls.  Memory and ability to understand (cognition).  Work and work Astronomer.  Reproductive health. Screening  You may have the following tests or measurements:  Height, weight, and BMI.  Blood pressure.  Lipid and cholesterol levels. These may be checked every 5 years, or more frequently if you are over 44 years old.  Skin check.  Lung cancer screening. You may have this screening every year starting at age 89 if you have a 30-pack-year history of smoking and currently smoke or have quit within the past 15 years.  Fecal occult blood test (FOBT) of the stool. You may have this test every year starting at age 70.  Flexible sigmoidoscopy or colonoscopy. You may have a sigmoidoscopy every 5 years or a colonoscopy every 10 years starting at age 60.  Hepatitis C blood test.  Hepatitis B blood test.  Sexually transmitted disease (STD) testing.  Diabetes screening. This is done by checking your blood sugar (glucose) after you have not eaten for a while (fasting). You may have this done every 1-3 years.  Bone density scan. This is done to screen for osteoporosis. You may have this done starting at age 49.  Mammogram. This may be done every 1-2 years. Talk to your health care provider about how often you should have regular mammograms. Talk with your health care provider about your test results, treatment options, and if necessary, the need for more tests. Vaccines  Your health care provider may recommend certain vaccines, such as:  Influenza vaccine. This is recommended every year.  Tetanus, diphtheria, and acellular pertussis (Tdap, Td) vaccine.  You may need a Td booster every 10 years.  Zoster vaccine. You may need this after age 67.  Pneumococcal 13-valent conjugate (PCV13) vaccine. One dose  is recommended after age 68.  Pneumococcal polysaccharide (PPSV23) vaccine. One dose is recommended after age 55. Talk to your health care provider about which screenings and vaccines you need and how often you need them. This information is not intended to replace advice given to you by your health care provider. Make sure you discuss any questions you have with your health care provider. Document Released: 11/20/2015 Document Revised: 07/13/2016 Document Reviewed: 08/25/2015 Elsevier Interactive Patient Education  2017 [employer].  Fall Prevention in the Home Falls can cause injuries. They can happen to people of all ages. There are many things you can do to make your home safe and to help prevent falls. What can I do on the outside of my home?  Regularly fix the edges of walkways and driveways and fix any cracks.  Remove anything that might make you trip as you walk through a door, such as a raised step or threshold.  Trim any bushes or trees on the path to your home.  Use bright outdoor lighting.  Clear any walking paths of anything that might make someone trip, such as rocks or tools.  Regularly check to see if handrails are loose or broken. Make sure that both sides of any steps have handrails.  Any raised decks and porches should have guardrails on the edges.  Have any leaves, snow, or ice cleared regularly.  Use sand or salt on walking paths during winter.  Clean up any spills in your garage right away. This includes oil or grease spills. What can I do in the bathroom?  Use night lights.  Install grab bars by the toilet and in the tub and shower. Do not use towel bars as grab bars.  Use non-skid mats or decals in the tub or shower.  If you need to sit down in the shower, use a plastic, non-slip stool.  Keep the floor dry. Clean up any water that spills on the floor as soon as it happens.  Remove soap buildup in the tub or shower regularly.  Attach bath mats  securely with double-sided non-slip rug tape.  Do not have throw rugs and other things on the floor that can make you trip. What can I do in the bedroom?  Use night lights.  Make sure that you have a light by your bed that is easy to reach.  Do not use any sheets or blankets that are too big for your bed. They should not hang down onto the floor.  Have a firm chair that has side arms. You can use this for support while you get dressed.  Do not have throw rugs and other things on the floor that can make you trip. What can I do in the kitchen?  Clean up any spills right away.  Avoid walking on wet floors.  Keep items that you use a lot in easy-to-reach places.  If you need to reach something above you, use a strong step stool that has a grab bar.  Keep electrical cords out of the way.  Do not use floor polish or wax that makes floors slippery. If you must use wax, use non-skid floor wax.  Do not have throw rugs and other things on the floor that can make you trip. What can I do with my stairs?  Do not leave any  items on the stairs.  Make sure that there are handrails on both sides of the stairs and use them. Fix handrails that are broken or loose. Make sure that handrails are as long as the stairways.  Check any carpeting to make sure that it is firmly attached to the stairs. Fix any carpet that is loose or worn.  Avoid having throw rugs at the top or bottom of the stairs. If you do have throw rugs, attach them to the floor with carpet tape.  Make sure that you have a light switch at the top of the stairs and the bottom of the stairs. If you do not have them, ask someone to add them for you. What else can I do to help prevent falls?  Wear shoes that:  Do not have high heels.  Have rubber bottoms.  Are comfortable and fit you well.  Are closed at the toe. Do not wear sandals.  If you use a stepladder:  Make sure that it is fully opened. Do not climb a closed  stepladder.  Make sure that both sides of the stepladder are locked into place.  Ask someone to hold it for you, if possible.  Clearly mark and make sure that you can see:  Any grab bars or handrails.  First and last steps.  Where the edge of each step is.  Use tools that help you move around (mobility aids) if they are needed. These include:  Canes.  Walkers.  Scooters.  Crutches.  Turn on the lights when you go into a dark area. Replace any light bulbs as soon as they burn out.  Set up your furniture so you have a clear path. Avoid moving your furniture around.  If any of your floors are uneven, fix them.  If there are any pets around you, be aware of where they are.  Review your medicines with your doctor. Some medicines can make you feel dizzy. This can increase your chance of falling. Ask your doctor what other things that you can do to help prevent falls. This information is not intended to replace advice given to you by your health care provider. Make sure you discuss any questions you have with your health care provider. Document Released: 08/20/2009 Document Revised: 03/31/2016 Document Reviewed: 11/28/2014 Elsevier Interactive Patient Education  2017 [employer].

## 2021-03-25 NOTE — Progress Notes (Signed)
I connected with Orland Penman today by telephone and verified that I am speaking with the correct person using two identifiers. Location patient: home Location provider: work Persons participating in the virtual visit: Guy Franco LPN.   I discussed the limitations, risks, security and privacy concerns of performing an evaluation and management service by telephone and the availability of in person appointments. I also discussed with the patient that there may be a patient responsible charge related to this service. The patient expressed understanding and verbally consented to this telephonic visit.    Interactive audio and video telecommunications were attempted between this provider and patient, however failed, due to patient having technical difficulties OR patient did not have access to video capability.  We continued and completed visit with audio only.     Vital signs may be patient reported or missing.  Subjective:   Anita Carpenter is a 75 y.o. female who presents for Medicare Annual (Subsequent) preventive examination.  Review of Systems     Cardiac Risk Factors include: diabetes mellitus;hypertension;obesity (BMI >30kg/m2);sedentary lifestyle     Objective:    Today's Vitals   03/25/21 1026  Weight: 145 lb (65.8 kg)  Height: 4\' 10"  (1.473 m)   Body mass index is 30.31 kg/m.  Advanced Directives 03/25/2021 01/16/2020 01/09/2019 06/10/2014  Does Patient Have a Medical Advance Directive? No No No Patient does not have advance directive;Patient would not like information  Would patient like information on creating a medical advance directive? - - Yes (MAU/Ambulatory/Procedural Areas - Information given) -    Current Medications (verified) Outpatient Encounter Medications as of 03/25/2021  Medication Sig  . amLODipine (NORVASC) 5 MG tablet TAKE 1 TABLET BY MOUTH EVERY DAY  . ferrous sulfate 325 (65 FE) MG tablet Take 1 tablet (325 mg total) by mouth daily with  breakfast.  . glucose blood test strip 1 each by Other route daily. Use as instructed  . hydrALAZINE (APRESOLINE) 25 MG tablet TAKE 1 TABLET BY MOUTH EVERY DAY  . metoprolol tartrate (LOPRESSOR) 25 MG tablet TAKE 1 TABLET BY MOUTH EVERY DAY  . rosuvastatin (CRESTOR) 10 MG tablet TAKE 1 TABLET BY MOUTH EVERY DAY  . Vitamin D, Ergocalciferol, (DRISDOL) 1.25 MG (50000 UNIT) CAPS capsule Take 1 capsule (50,000 Units total) by mouth every 7 (seven) days.  03/27/2021 olmesartan-hydrochlorothiazide (BENICAR HCT) 40-25 MG tablet Take 1 tablet by mouth daily.   No facility-administered encounter medications on file as of 03/25/2021.    Allergies (verified) Doxycycline   History: Past Medical History:  Diagnosis Date  . Asthma   . Diabetes mellitus without complication (HCC)   . High cholesterol   . Hypertension    Past Surgical History:  Procedure Laterality Date  . TONSILLECTOMY     Family History  Problem Relation Age of Onset  . Asthma Other   . Diabetes Other   . Hyperlipidemia Other   . Hyperlipidemia Mother   . Hypertension Mother   . Kidney failure Father   . Stroke Father    Social History   Socioeconomic History  . Marital status: Divorced    Spouse name: Not on file  . Number of children: Not on file  . Years of education: Not on file  . Highest education level: Not on file  Occupational History  . Occupation: retired  Tobacco Use  . Smoking status: Former Smoker    Packs/day: 0.25    Years: 50.00    Pack years: 12.50    Types:  Cigarettes    Start date: 57    Quit date: 12/03/2018    Years since quitting: 2.3  . Smokeless tobacco: Never Used  . Tobacco comment: 1ppdx  Vaping Use  . Vaping Use: Never used  Substance and Sexual Activity  . Alcohol use: Not Currently  . Drug use: Never  . Sexual activity: Not Currently  Other Topics Concern  . Not on file  Social History Narrative  . Not on file   Social Determinants of Health   Financial Resource Strain:  Low Risk   . Difficulty of Paying Living Expenses: Not hard at all  Food Insecurity: No Food Insecurity  . Worried About Programme researcher, broadcasting/film/video in the Last Year: Never true  . Ran Out of Food in the Last Year: Never true  Transportation Needs: No Transportation Needs  . Lack of Transportation (Medical): No  . Lack of Transportation (Non-Medical): No  Physical Activity: Inactive  . Days of Exercise per Week: 0 days  . Minutes of Exercise per Session: 0 min  Stress: No Stress Concern Present  . Feeling of Stress : Not at all  Social Connections: Not on file    Tobacco Counseling Counseling given: Not Answered Comment: 1ppdx   Clinical Intake:  Pre-visit preparation completed: Yes  Pain : No/denies pain     Nutritional Status: BMI > 30  Obese Nutritional Risks: None Diabetes: Yes  How often do you need to have someone help you when you read instructions, pamphlets, or other written materials from your doctor or pharmacy?: 1 - Never What is the last grade level you completed in school?: 59yr college  Diabetic? Yes Nutrition Risk Assessment:  Has the patient had any N/V/D within the last 2 months?  No  Does the patient have any non-healing wounds?  No  Has the patient had any unintentional weight loss or weight gain?  No   Diabetes:  Is the patient diabetic?  Yes  If diabetic, was a CBG obtained today?  No  Did the patient bring in their glucometer from home?  No  How often do you monitor your CBG's? daily.   Financial Strains and Diabetes Management:  Are you having any financial strains with the device, your supplies or your medication? No .  Does the patient want to be seen by Chronic Care Management for management of their diabetes?  No  Would the patient like to be referred to a Nutritionist or for Diabetic Management?  No   Diabetic Exams:  Diabetic Eye Exam: Completed 08/26/2020 Diabetic Foot Exam: Completed 02/24/2021   Interpreter Needed?:  No  Information entered by :: NAllen LPN   Activities of Daily Living In your present state of health, do you have any difficulty performing the following activities: 03/25/2021 02/24/2021  Hearing? N N  Vision? N N  Difficulty concentrating or making decisions? N N  Walking or climbing stairs? N N  Dressing or bathing? N N  Doing errands, shopping? N N  Preparing Food and eating ? N -  Using the Toilet? N -  In the past six months, have you accidently leaked urine? Y -  Do you have problems with loss of bowel control? N -  Managing your Medications? N -  Managing your Finances? N -  Housekeeping or managing your Housekeeping? N -  Some recent data might be hidden    Patient Care Team: Dorothyann Peng, MD as PCP - General (Internal Medicine)  Indicate any recent Medical Services  you may have received from other than [facility] providers in the past year (date may be approximate).     Assessment:   This is a routine wellness examination for [place].  Hearing/Vision screen No exam data present  Dietary issues and exercise activities discussed: Current Exercise Habits: The patient does not participate in regular exercise at present  Goals Addressed            This Visit's Progress   . Patient Stated       03/25/2021, wants to exercise more      Depression Screen PHQ 2/9 Scores 03/25/2021 02/24/2021 01/16/2020 01/09/2019 11/08/2018 09/17/2018 06/10/2014  PHQ - 2 Score 0 0 0 0 0 0 0  PHQ- 9 Score - 0 - 0 - - -    Fall Risk Fall Risk  03/25/2021 02/24/2021 01/16/2020 06/05/2019 01/09/2019  Falls in the past year? 0 0 0 0 0  Risk for fall due to : Medication side effect - Medication side effect - Medication side effect  Follow up Falls evaluation completed;Education provided;Falls prevention discussed - Falls evaluation completed;Education provided;Falls prevention discussed - Education provided;Falls prevention discussed    FALL RISK PREVENTION PERTAINING TO THE HOME:  Any stairs in or  around the home? No  If so, are there any without handrails? n/a Home free of loose throw rugs in walkways, pet beds, electrical cords, etc? Yes  Adequate lighting in your home to reduce risk of falls? Yes   ASSISTIVE DEVICES UTILIZED TO PREVENT FALLS:  Life alert? No  Use of a cane, walker or w/c? No  Grab bars in the bathroom? No  Shower chair or bench in shower? No  Elevated toilet seat or a handicapped toilet? No   TIMED UP AND GO:  Was the test performed? No .  Cognitive Function:     6CIT Screen 03/25/2021 01/16/2020 01/09/2019  What Year? 0 points 0 points 0 points  What month? 0 points 0 points 0 points  What time? 0 points 0 points 0 points  Count back from 20 0 points 0 points 0 points  Months in reverse 0 points 0 points 0 points  Repeat phrase 2 points 0 points 0 points  Total Score 2 0 0    Immunizations Immunization History  Administered Date(s) Administered  . Fluad Quad(high Dose 65+) 07/03/2019, 08/18/2020  . Influenza-Unspecified 07/20/2018  . PFIZER(Purple Top)SARS-COV-2 Vaccination 12/21/2019, 01/15/2020, 09/19/2020    TDAP status: Up to date  Flu Vaccine status: Up to date  Pneumococcal vaccine status: Due, Education has been provided regarding the importance of this vaccine. Advised may receive this vaccine at local pharmacy or Health Dept. Aware to provide a copy of the vaccination record if obtained from local pharmacy or Health Dept. Verbalized acceptance and understanding.  Covid-19 vaccine status: Completed vaccines  Qualifies for Shingles Vaccine? Yes   Zostavax completed No   Shingrix Completed?: No.    Education has been provided regarding the importance of this vaccine. Patient has been advised to call insurance company to determine out of pocket expense if they have not yet received this vaccine. Advised may also receive vaccine at local pharmacy or Health Dept. Verbalized acceptance and understanding.  Screening Tests Health Maintenance   Topic Date Due  . PNA vac Low Risk Adult (2 of 2 - PPSV23) 07/28/2020  . INFLUENZA VACCINE  06/07/2021  . HEMOGLOBIN A1C  08/26/2021  . OPHTHALMOLOGY EXAM  08/26/2021  . MAMMOGRAM  08/31/2021  . FOOT EXAM  02/24/2022  .  TETANUS/TDAP  08/06/2023  . COLONOSCOPY (Pts 45-68yrs Insurance coverage will need to be confirmed)  08/19/2027  . DEXA SCAN  Completed  . COVID-19 Vaccine  Completed  . Hepatitis C Screening  Completed  . HPV VACCINES  Aged Out    Health Maintenance  Health Maintenance Due  Topic Date Due  . PNA vac Low Risk Adult (2 of 2 - PPSV23) 07/28/2020    Colorectal cancer screening: Type of screening: Colonoscopy. Completed 08/18/2017. Repeat every 10 years  Mammogram status: Completed 08/31/2020. Repeat every year  Bone Density status: Completed 11/02/2016.   Lung Cancer Screening: (Low Dose CT Chest recommended if Age 46-80 years, 30 pack-year currently smoking OR have quit w/in 15years.) does not qualify.   Lung Cancer Screening Referral: no  Additional Screening:  Hepatitis C Screening: does qualify; Completed 02/24/2021  Vision Screening: Recommended annual ophthalmology exams for early detection of glaucoma and other disorders of the eye. Is the patient up to date with their annual eye exam?  Yes  Who is the provider or what is the name of the office in which the patient attends annual eye exams? [facility] If pt is not established with a provider, would they like to be referred to a provider to establish care? No .   Dental Screening: Recommended annual dental exams for proper oral hygiene  Community Resource Referral / Chronic Care Management: CRR required this visit?  No   CCM required this visit?  No      Plan:     I have personally reviewed and noted the following in the patient's chart:   . Medical and social history . Use of alcohol, tobacco or illicit drugs  . Current medications and supplements including opioid prescriptions.   . Functional ability and status . Nutritional status . Physical activity . Advanced directives . List of other physicians . Hospitalizations, surgeries, and ER visits in previous 12 months . Vitals . Screenings to include cognitive, depression, and falls . Referrals and appointments  In addition, I have reviewed and discussed with patient certain preventive protocols, quality metrics, and best practice recommendations. A written personalized care plan for preventive services as well as general preventive health recommendations were provided to patient.     Barb Merino, LPN   [id number]   Nurse Notes:

## 2021-06-02 ENCOUNTER — Ambulatory Visit: Payer: Medicare Other | Admitting: Internal Medicine

## 2021-06-10 ENCOUNTER — Ambulatory Visit: Payer: Medicare Other | Admitting: Internal Medicine

## 2021-07-05 ENCOUNTER — Other Ambulatory Visit: Payer: Self-pay

## 2021-07-05 ENCOUNTER — Ambulatory Visit (INDEPENDENT_AMBULATORY_CARE_PROVIDER_SITE_OTHER): Payer: Medicare Other | Admitting: Internal Medicine

## 2021-07-05 ENCOUNTER — Encounter: Payer: Self-pay | Admitting: Internal Medicine

## 2021-07-05 VITALS — BP 136/88 | HR 74 | Temp 98.1°F | Ht <= 58 in | Wt 153.0 lb

## 2021-07-05 DIAGNOSIS — E1122 Type 2 diabetes mellitus with diabetic chronic kidney disease: Secondary | ICD-10-CM | POA: Diagnosis not present

## 2021-07-05 DIAGNOSIS — Z23 Encounter for immunization: Secondary | ICD-10-CM | POA: Diagnosis not present

## 2021-07-05 DIAGNOSIS — IMO0002 Reserved for concepts with insufficient information to code with codable children: Secondary | ICD-10-CM

## 2021-07-05 DIAGNOSIS — Z6831 Body mass index (BMI) 31.0-31.9, adult: Secondary | ICD-10-CM

## 2021-07-05 DIAGNOSIS — I129 Hypertensive chronic kidney disease with stage 1 through stage 4 chronic kidney disease, or unspecified chronic kidney disease: Secondary | ICD-10-CM | POA: Diagnosis not present

## 2021-07-05 DIAGNOSIS — E1165 Type 2 diabetes mellitus with hyperglycemia: Secondary | ICD-10-CM

## 2021-07-05 DIAGNOSIS — N182 Chronic kidney disease, stage 2 (mild): Secondary | ICD-10-CM

## 2021-07-05 DIAGNOSIS — E6609 Other obesity due to excess calories: Secondary | ICD-10-CM | POA: Diagnosis not present

## 2021-07-05 MED ORDER — SHINGRIX 50 MCG/0.5ML IM SUSR: 0.5000 mL | Freq: Once | INTRAMUSCULAR | 0 refills | Status: AC

## 2021-07-05 NOTE — Patient Instructions (Signed)

## 2021-07-05 NOTE — Progress Notes (Signed)
I,Katawbba Wiggins,acting as a Education administrator for [name], MD.,have documented all relevant documentation on the behalf of [name], MD,as directed by  [name], MD while in the presence of [name], MD.  This visit occurred during the SARS-CoV-2 public health emergency.  Safety protocols were in place, including screening questions prior to the visit, additional usage of staff PPE, and extensive cleaning of exam room while observing appropriate contact time as indicated for disinfecting solutions.  Subjective:     Patient ID: Anita Carpenter , female    DOB: [birth year] , 75 y.o.   MRN: [id number]   Chief Complaint  Patient presents with   Diabetes   Hypertension     HPI  The patient is here today for a diabetes and blood pressure f/u.  She reports compliance with meds. She denies headaches, chest pain and shortness of breath.   Diabetes She presents for her follow-up diabetic visit. She has type 2 diabetes mellitus. Her disease course has been stable. There are no hypoglycemic associated symptoms. Pertinent negatives for diabetes include no blurred vision and no chest pain. There are no hypoglycemic complications. Diabetic complications include nephropathy. Risk factors for coronary artery disease include tobacco exposure, diabetes mellitus, dyslipidemia, hypertension, post-menopausal and sedentary lifestyle. She participates in exercise intermittently. An ACE inhibitor/angiotensin II receptor blocker is being taken.  Hypertension This is a chronic problem. Pertinent negatives include no blurred vision, chest pain or shortness of breath. Risk factors for coronary artery disease include sedentary lifestyle and diabetes mellitus. The current treatment provides moderate improvement. Compliance problems include exercise.     Past Medical History:  Diagnosis Date   Asthma    Diabetes mellitus without complication (HCC)    High cholesterol    Hypertension      Family  History  Problem Relation Age of Onset   Asthma Other    Diabetes Other    Hyperlipidemia Other    Hyperlipidemia Mother    Hypertension Mother    Kidney failure Father    Stroke Father      Current Outpatient Medications:    amLODipine (NORVASC) 5 MG tablet, TAKE 1 TABLET BY MOUTH EVERY DAY, Disp: 90 tablet, Rfl: 1   ferrous sulfate 325 (65 FE) MG tablet, Take 1 tablet (325 mg total) by mouth daily with breakfast., Disp: 30 tablet, Rfl: 3   glucose blood test strip, 1 each by Other route daily. Use as instructed, Disp: , Rfl:    hydrALAZINE (APRESOLINE) 25 MG tablet, TAKE 1 TABLET BY MOUTH EVERY DAY, Disp: 90 tablet, Rfl: 1   metoprolol tartrate (LOPRESSOR) 25 MG tablet, TAKE 1 TABLET BY MOUTH EVERY DAY, Disp: 90 tablet, Rfl: 1   rosuvastatin (CRESTOR) 10 MG tablet, TAKE 1 TABLET BY MOUTH EVERY DAY, Disp: 90 tablet, Rfl: 1   Zoster Vaccine Adjuvanted (SHINGRIX) injection, Inject 0.5 mLs into the muscle once for 1 dose., Disp: 0.5 mL, Rfl: 0   Allergies  Allergen Reactions   Doxycycline Rash   Olmesartan Medoxomil-Hctz Anxiety     Review of Systems  Constitutional: Negative.   Eyes:  Negative for blurred vision.  Respiratory: Negative.  Negative for shortness of breath.   Cardiovascular: Negative.  Negative for chest pain.  Gastrointestinal: Negative.   Neurological: Negative.   Psychiatric/Behavioral: Negative.    All other systems reviewed and are negative.   Today's Vitals   07/05/21 1406 07/05/21 1422  BP: (!) 164/90 136/88  Pulse: 74   Temp: 98.1  F (36.7 C)   TempSrc: Oral   Weight: 153 lb (69.4 kg)   Height: _0  (1.473 m)   PainSc: 0-No pain    Body mass index is 31.98 kg/m.  Wt Readings from Last 3 Encounters:  07/05/21 153 lb (69.4 kg)  03/25/21 145 lb (65.8 kg)  02/24/21 149 lb (67.6 kg)    BP Readings from Last 3 Encounters:  07/05/21 136/88  02/24/21 138/74  08/18/20 122/76    Objective:  Physical Exam Vitals and nursing note reviewed.   Constitutional:      Appearance: Normal appearance.  HENT:     Head: Normocephalic and atraumatic.     Nose:     Comments: Masked     Mouth/Throat:     Comments: Masked  Eyes:     Extraocular Movements: Extraocular movements intact.  Cardiovascular:     Rate and Rhythm: Normal rate and regular rhythm.     Heart sounds: Normal heart sounds.  Pulmonary:     Effort: Pulmonary effort is normal.     Breath sounds: Normal breath sounds.  Musculoskeletal:     Cervical back: Normal range of motion.  Skin:    General: Skin is warm.  Neurological:     General: No focal deficit present.     Mental Status: She is alert.  Psychiatric:        Mood and Affect: Mood normal.        Behavior: Behavior normal.        Assessment And Plan:     1. 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 Encouraged to follow dietary guidelines. She will f/u in four months for re-evaluation.  - BMP8+EGFR - Hemoglobin A1c  2. Hypertensive nephropathy Comments: Uncontrolled. Admits she did not take meds this am. Repeat BP improved. No med changes today. I wished her a very happy birthday!   3. Class 1 obesity due to excess calories with serious comorbidity and body mass index (BMI) of 31.0 to 31.9 in adult Comments: She is encouraged to strive to lose 7-10 pounds to decrease cardiac risk. Advised to aim for at least 150 minutes of exercise per week.  4. Immunization due Comments: She will be given Pneumovax-23 IM x 1.  I will also send rx Shingrix to his local pharmacy.     Patient was given opportunity to ask questions. Patient verbalized understanding of the plan and was able to repeat key elements of the plan. All questions were answered to their satisfaction.   I, [name], MD, have reviewed all documentation for this visit. The documentation on 07/05/21 for the exam, diagnosis, procedures, and orders are all  accurate and complete.   IF YOU HAVE BEEN REFERRED TO A SPECIALIST, IT MAY TAKE 1-2 WEEKS TO SCHEDULE/PROCESS THE REFERRAL. IF YOU HAVE NOT HEARD FROM US/SPECIALIST IN TWO WEEKS, PLEASE GIVE [place] A CALL AT [phone].   THE PATIENT IS ENCOURAGED TO PRACTICE SOCIAL DISTANCING DUE TO THE COVID-19 PANDEMIC.

## 2021-07-06 LAB — BMP8+EGFR
BUN/Creatinine Ratio: 14 (ref 12–28)
BUN: 12 mg/dL (ref 8–27)
CO2: 26 mmol/L (ref 20–29)
Calcium: 10.3 mg/dL (ref 8.7–10.3)
Chloride: 104 mmol/L (ref 96–106)
Creatinine, Ser: 0.83 mg/dL (ref 0.57–1.00)
Glucose: 100 mg/dL — ABNORMAL HIGH (ref 65–99)
Potassium: 4.3 mmol/L (ref 3.5–5.2)
Sodium: 141 mmol/L (ref 134–144)
eGFR: 74 mL/min/{1.73_m2} (ref >59)

## 2021-07-06 LAB — HEMOGLOBIN A1C
Est. average glucose Bld gHb Est-mCnc: 143 mg/dL
Hgb A1c MFr Bld: 6.6 % — ABNORMAL HIGH (ref 4.8–5.6)

## 2021-07-17 ENCOUNTER — Other Ambulatory Visit: Payer: Self-pay | Admitting: Internal Medicine

## 2021-08-24 ENCOUNTER — Ambulatory Visit (INDEPENDENT_AMBULATORY_CARE_PROVIDER_SITE_OTHER): Payer: Medicare Other

## 2021-08-24 ENCOUNTER — Other Ambulatory Visit: Payer: Self-pay

## 2021-08-24 VITALS — BP 124/82 | HR 63 | Temp 98.4°F | Ht <= 58 in

## 2021-08-24 DIAGNOSIS — Z23 Encounter for immunization: Secondary | ICD-10-CM

## 2021-08-24 NOTE — Progress Notes (Signed)
The patient is here today for a flu vaccination. 

## 2021-08-31 LAB — HM DIABETES EYE EXAM

## 2021-09-10 LAB — HM MAMMOGRAPHY

## 2021-09-14 ENCOUNTER — Encounter: Payer: Self-pay | Admitting: Nurse Practitioner

## 2021-11-10 ENCOUNTER — Ambulatory Visit: Payer: Medicare Other | Admitting: Nurse Practitioner

## 2022-03-03 ENCOUNTER — Encounter: Payer: Medicare Other | Admitting: Nurse Practitioner

## 2022-03-29 ENCOUNTER — Encounter: Payer: Medicare Other | Admitting: Internal Medicine

## 2022-04-13 ENCOUNTER — Ambulatory Visit: Payer: Medicare Other

## 2022-04-13 ENCOUNTER — Ambulatory Visit: Payer: Medicare Other | Admitting: Internal Medicine

## 2022-04-18 ENCOUNTER — Telehealth: Payer: Self-pay | Admitting: Internal Medicine

## 2022-04-18 NOTE — Telephone Encounter (Signed)
Left message for patient to call back and schedule Medicare Annual Wellness Visit (AWV) either virtually or in office.  Left both my jabber number [phone] and office number    Last AWV ;03/25/21  please schedule at anytime with TIMA - THN     

## 2022-04-25 ENCOUNTER — Telehealth: Payer: Self-pay | Admitting: Internal Medicine

## 2022-04-25 NOTE — Telephone Encounter (Signed)
Left message for patient to call back and schedule Medicare Annual Wellness Visit (AWV) either virtually or in office.  Left both my jabber number [phone] and office number    Last AWV ;03/25/21  please schedule at anytime with TIMA - THN     

## 2022-05-23 ENCOUNTER — Telehealth: Payer: Self-pay | Admitting: Internal Medicine

## 2022-05-23 NOTE — Telephone Encounter (Signed)
Left message for patient to call back and schedule Medicare Annual Wellness Visit (AWV) either virtually or in office.  Left both my jabber number [phone] and office number    Last AWV ;03/25/21  please schedule at anytime with TIMA - THN     

## 2022-05-31 ENCOUNTER — Other Ambulatory Visit: Payer: Self-pay | Admitting: Internal Medicine

## 2022-05-31 NOTE — Telephone Encounter (Signed)
Pt needs appt for further refills. 

## 2022-06-02 ENCOUNTER — Telehealth: Payer: Self-pay | Admitting: Internal Medicine

## 2022-06-02 NOTE — Telephone Encounter (Signed)
Left message for patient to call back and schedule Medicare Annual Wellness Visit (AWV) either virtually or in office.  Left both my jabber number [phone] and office number    Last AWV ;03/25/21  please schedule at anytime with TIMA - THN     

## 2022-06-08 ENCOUNTER — Telehealth: Payer: Self-pay | Admitting: Internal Medicine

## 2022-06-08 NOTE — Telephone Encounter (Signed)
Left message for patient to call back and schedule Medicare Annual Wellness Visit (AWV) either virtually or in office.  Left both my jabber number [phone] and office number    Last AWV ;03/25/21  please schedule at anytime with TIMA - THN     

## 2022-06-13 ENCOUNTER — Other Ambulatory Visit: Payer: Self-pay | Admitting: Nurse Practitioner

## 2022-06-23 ENCOUNTER — Telehealth: Payer: Self-pay | Admitting: Internal Medicine

## 2022-06-23 NOTE — Telephone Encounter (Signed)
Left message for patient to call back and schedule Medicare Annual Wellness Visit (AWV) either virtually or in office.  Left both my jabber number [phone] and office number    Last AWV ;03/25/21  please schedule at anytime with TIMA - THN     

## 2022-07-07 ENCOUNTER — Encounter: Payer: Self-pay | Admitting: *Deleted

## 2022-07-07 ENCOUNTER — Telehealth: Payer: Self-pay | Admitting: Internal Medicine

## 2022-07-07 NOTE — Telephone Encounter (Signed)
Left message for patient to call back and schedule Medicare Annual Wellness Visit (AWV) either virtually or in office.  Left both my jabber number [phone] and office number    Last AWV ;03/25/21  please schedule at anytime with TIMA - THN     

## 2022-07-07 NOTE — Progress Notes (Signed)
[facility] Quality Team Note  Name: OTHELLO SGROI Date of Birth: [birth year] MRN: [id number] Date: 07/07/2022  [facility] Quality Team has reviewed this patient's chart, please see recommendations below:  [facility] Quality Other; (Pt was contacted by someone today to schedule AWV.  If pt calls back, can we make sure A1C is ordered at next visit to close HBD A1C gap?)

## 2022-07-28 ENCOUNTER — Encounter: Payer: Self-pay | Admitting: Internal Medicine

## 2022-08-04 ENCOUNTER — Telehealth: Payer: Self-pay | Admitting: Internal Medicine

## 2022-08-04 NOTE — Telephone Encounter (Signed)
Left message for patient to call back and schedule Medicare Annual Wellness Visit (AWV) either virtually or in office. Left  my jabber number [phone]   Last AWV   03/25/21 please schedule with Nurse Health Adviser   45 min for awv-i and in office appointments 30 min for awv-s  phone/virtual appointments  

## 2022-08-23 ENCOUNTER — Telehealth: Payer: Self-pay | Admitting: Internal Medicine

## 2022-08-23 NOTE — Telephone Encounter (Signed)
Left message for patient to call back and schedule Medicare Annual Wellness Visit (AWV) either virtually or in office. Left  my jabber number [phone]   Last AWV   03/25/21 please schedule with Nurse Health Adviser   45 min for awv-i and in office appointments 30 min for awv-s  phone/virtual appointments  

## 2022-09-01 LAB — HM DIABETES EYE EXAM

## 2022-09-06 ENCOUNTER — Telehealth: Payer: Self-pay | Admitting: Internal Medicine

## 2022-09-06 NOTE — Telephone Encounter (Signed)
Left message for patient to call back and schedule Medicare Annual Wellness Visit (AWV) either virtually or in office. Left  my jabber number [phone]   Last AWV   03/25/21 please schedule with Nurse Health Adviser   45 min for awv-i and in office appointments 30 min for awv-s  phone/virtual appointments  

## 2022-09-26 ENCOUNTER — Telehealth: Payer: Self-pay | Admitting: Internal Medicine

## 2022-09-26 NOTE — Telephone Encounter (Signed)
Returned patients call from 09/23/21   Left message for patient to call back and schedule Medicare Annual Wellness Visit (AWV) either virtually or in office. Left  my Anita Carpenter number [phone]   Last AWV  03/25/21 please schedule with Nurse Health Adviser   45 min for awv-i and in office appointments 30 min for awv-s  phone/virtual appointments

## 2022-09-27 ENCOUNTER — Telehealth: Payer: Self-pay | Admitting: Internal Medicine

## 2022-09-27 NOTE — Telephone Encounter (Signed)
Left message for patient to call back and schedule Medicare Annual Wellness Visit (AWV) either virtually or in office. Left  my jabber number [phone]   Last AWV   03/25/21 please schedule with Nurse Health Adviser   45 min for awv-i and in office appointments 30 min for awv-s  phone/virtual appointments  

## 2022-10-03 LAB — HM MAMMOGRAPHY

## 2022-10-19 ENCOUNTER — Ambulatory Visit (INDEPENDENT_AMBULATORY_CARE_PROVIDER_SITE_OTHER): Payer: Medicare Other

## 2022-10-19 ENCOUNTER — Encounter: Payer: Self-pay | Admitting: Internal Medicine

## 2022-10-19 VITALS — Ht <= 58 in | Wt 152.0 lb

## 2022-10-19 DIAGNOSIS — Z0000 Encounter for general adult medical examination without abnormal findings: Secondary | ICD-10-CM | POA: Diagnosis not present

## 2022-10-19 NOTE — Progress Notes (Addendum)
I connected with Anita Carpenter today by telephone and verified that I am speaking with the correct person using two identifiers. Location patient: home Location provider: work Persons participating in the virtual visit: Anita Kern LPN.   I discussed the limitations, risks, security and privacy concerns of performing an evaluation and management service by telephone and the availability of in person appointments. I also discussed with the patient that there may be a patient responsible charge related to this service. The patient expressed understanding and verbally consented to this telephonic visit.    Interactive audio and video telecommunications were attempted between this provider and patient, however failed, due to patient having technical difficulties OR patient did not have access to video capability.  We continued and completed visit with audio only.     Vital signs may be patient reported or missing.  Subjective:   Anita Carpenter is a 76 y.o. female who presents for Medicare Annual (Subsequent) preventive examination.  Review of Systems     Cardiac Risk Factors include: advanced age (>63mn, >>38women);diabetes mellitus;obesity (BMI >30kg/m2)     Objective:    Today's Vitals   10/19/22 1127  Weight: 152 lb (68.9 kg)  Height: _0  (1.473 m)   Body mass index is 31.77 kg/m.     10/19/2022   11:34 AM 03/25/2021   10:34 AM 01/16/2020    2:13 PM 01/09/2019    2:58 PM 06/10/2014   11:11 AM  Advanced Directives  Does Patient Have a Medical Advance Directive? No No No No Patient does not have advance directive;Patient would not like information  Would patient like information on creating a medical advance directive?    Yes (MAU/Ambulatory/Procedural Areas - Information given)     Current Medications (verified) Outpatient Encounter Medications as of 10/19/2022  Medication Sig   amLODipine (NORVASC) 5 MG tablet TAKE 1 TABLET BY MOUTH EVERY DAY    Cholecalciferol (VITAMIN D-3) 125 MCG (5000 UT) TABS Take 1 tablet by mouth 2 (two) times a week.   ferrous sulfate 325 (65 FE) MG tablet Take 1 tablet (325 mg total) by mouth daily with breakfast.   glucose blood test strip 1 each by Other route daily. Use as instructed   hydrALAZINE (APRESOLINE) 25 MG tablet TAKE 1 TABLET BY MOUTH EVERY DAY   metoprolol tartrate (LOPRESSOR) 25 MG tablet TAKE 1 TABLET BY MOUTH EVERY DAY   rosuvastatin (CRESTOR) 10 MG tablet TAKE 1 TABLET BY MOUTH EVERY DAY   No facility-administered encounter medications on file as of 10/19/2022.    Allergies (verified) Doxycycline and Olmesartan medoxomil-hctz   History: Past Medical History:  Diagnosis Date   Asthma    Diabetes mellitus without complication (HCC)    High cholesterol    Hypertension    Past Surgical History:  Procedure Laterality Date   TONSILLECTOMY     Family History  Problem Relation Age of Onset   Asthma Other    Diabetes Other    Hyperlipidemia Other    Hyperlipidemia Mother    Hypertension Mother    Kidney failure Father    Stroke Father    Social History   Socioeconomic History   Marital status: Divorced    Spouse name: Not on file   Number of children: Not on file   Years of education: Not on file   Highest education level: Not on file  Occupational History   Occupation: retired  Tobacco Use   Smoking status: Former    Packs/day:  0.25    Years: 50.00    Total pack years: 12.50    Types: Cigarettes    Start date: 72    Quit date: 12/03/2018    Years since quitting: 3.8   Smokeless tobacco: Never   Tobacco comments:    1ppdx  Vaping Use   Vaping Use: Never used  Substance and Sexual Activity   Alcohol use: Not Currently   Drug use: Never   Sexual activity: Not Currently  Other Topics Concern   Not on file  Social History Narrative   Not on file   Social Determinants of Health   Financial Resource Strain: Low Risk  (10/19/2022)   Overall Financial  Resource Strain (CARDIA)    Difficulty of Paying Living Expenses: Not hard at all  Food Insecurity: No Food Insecurity (10/19/2022)   Hunger Vital Sign    Worried About Running Out of Food in the Last Year: Never true    Ran Out of Food in the Last Year: Never true  Transportation Needs: No Transportation Needs (10/19/2022)   PRAPARE - Hydrologist (Medical): No    Lack of Transportation (Non-Medical): No  Physical Activity: Inactive (10/19/2022)   Exercise Vital Sign    Days of Exercise per Week: 0 days    Minutes of Exercise per Session: 0 min  Stress: No Stress Concern Present (10/19/2022)   [place]    Feeling of Stress : Not at all  Social Connections: Not on file    Tobacco Counseling Counseling given: Not Answered Tobacco comments: 1ppdx   Clinical Intake:  Pre-visit preparation completed: Yes  Pain : No/denies pain     Nutritional Status: BMI > 30  Obese Nutritional Risks: None Diabetes: Yes  How often do you need to have someone help you when you read instructions, pamphlets, or other written materials from your doctor or pharmacy?: 1 - Never  Diabetic? Yes Nutrition Risk Assessment:  Has the patient had any N/V/D within the last 2 months?  No  Does the patient have any non-healing wounds?  No  Has the patient had any unintentional weight loss or weight gain?  No   Diabetes:  Is the patient diabetic?  Yes  If diabetic, was a CBG obtained today?  No  Did the patient bring in their glucometer from home?  No  How often do you monitor your CBG's? Three times weekly.   Financial Strains and Diabetes Management:  Are you having any financial strains with the device, your supplies or your medication? No .  Does the patient want to be seen by Chronic Care Management for management of their diabetes?  No  Would the patient like to be referred to a Nutritionist or  for Diabetic Management?  No   Diabetic Exams:  Diabetic Eye Exam: Completed 2023 Diabetic Foot Exam: Overdue, Pt has been advised about the importance in completing this exam. Pt is scheduled for diabetic foot exam on next appointment.   Interpreter Needed?: No  Information entered by :: NAllen LPN   Activities of Daily Living    10/19/2022   11:37 AM 10/18/2022    1:55 PM  In your present state of health, do you have any difficulty performing the following activities:  Hearing? 0 0  Vision? 0 0  Difficulty concentrating or making decisions? 0 0  Walking or climbing stairs? 0   Dressing or bathing? 0 0  Doing errands, shopping?  0 0  Preparing Food and eating ? N N  Using the Toilet? N N  In the past six months, have you accidently leaked urine? Y Y  Do you have problems with loss of bowel control? N N  Managing your Medications? N N  Managing your Finances? N N  Housekeeping or managing your Housekeeping? N N    Patient Care Team: Glendale Chard, MD as PCP - General (Internal Medicine)  Indicate any recent Medical Services you may have received from other than [facility] providers in the past year (date may be approximate).     Assessment:   This is a routine wellness examination for [place].  Hearing/Vision screen Vision Screening - Comments:: Regular eye exams, [facility]  Dietary issues and exercise activities discussed: Current Exercise Habits: The patient does not participate in regular exercise at present   Goals Addressed             This Visit's Progress    Patient Stated       10/19/2022, wants to exercise more       Depression Screen    10/19/2022   11:36 AM 03/25/2021   10:34 AM 02/24/2021   10:10 AM 01/16/2020    2:14 PM 01/09/2019    3:00 PM 11/08/2018    2:42 PM 09/17/2018   11:51 AM  PHQ 2/9 Scores  PHQ - 2 Score 0 0 0 0 0 0 0  PHQ- 9 Score   0  0      Fall Risk    10/19/2022   11:36 AM 10/18/2022    1:55 PM 03/25/2021   10:34 AM  02/24/2021   10:09 AM 01/16/2020    2:14 PM  Fall Risk   Falls in the past year? 0 0 0 0 0  Number falls in past yr: 0      Injury with Fall? 0      Risk for fall due to : Medication side effect  Medication side effect  Medication side effect  Follow up Falls prevention discussed;Education provided;Falls evaluation completed  Falls evaluation completed;Education provided;Falls prevention discussed  Falls evaluation completed;Education provided;Falls prevention discussed    FALL RISK PREVENTION PERTAINING TO THE HOME:  Any stairs in or around the home? No  If so, are there any without handrails?  N/a Home free of loose throw rugs in walkways, pet beds, electrical cords, etc? Yes  Adequate lighting in your home to reduce risk of falls? Yes   ASSISTIVE DEVICES UTILIZED TO PREVENT FALLS:  Life alert? No  Use of a cane, walker or w/c? No  Grab bars in the bathroom? No  Shower chair or bench in shower? No  Elevated toilet seat or a handicapped toilet? No   TIMED UP AND GO:  Was the test performed? No .       Cognitive Function:        10/19/2022   11:39 AM 03/25/2021   10:36 AM 01/16/2020    2:19 PM 01/09/2019    3:09 PM  6CIT Screen  What Year? 0 points 0 points 0 points 0 points  What month? 0 points 0 points 0 points 0 points  What time? 0 points 0 points 0 points 0 points  Count back from 20 0 points 0 points 0 points 0 points  Months in reverse 0 points 0 points 0 points 0 points  Repeat phrase 0 points 2 points 0 points 0 points  Total Score 0 points 2 points 0  points 0 points    Immunizations Immunization History  Administered Date(s) Administered   Fluad Quad(high Dose 65+) 07/03/2019, 08/18/2020, 08/24/2021   Influenza-Unspecified 07/20/2018   PFIZER Comirnaty(Gray Top)Covid-19 Tri-Sucrose Vaccine 04/07/2021   PFIZER(Purple Top)SARS-COV-2 Vaccination 12/21/2019, 01/15/2020, 09/19/2020   Pneumococcal Polysaccharide-23 07/05/2021    TDAP status: Due, Education  has been provided regarding the importance of this vaccine. Advised may receive this vaccine at local pharmacy or Health Dept. Aware to provide a copy of the vaccination record if obtained from local pharmacy or Health Dept. Verbalized acceptance and understanding.  Flu Vaccine status: Due, Education has been provided regarding the importance of this vaccine. Advised may receive this vaccine at local pharmacy or Health Dept. Aware to provide a copy of the vaccination record if obtained from local pharmacy or Health Dept. Verbalized acceptance and understanding.  Pneumococcal vaccine status: Due, Education has been provided regarding the importance of this vaccine. Advised may receive this vaccine at local pharmacy or Health Dept. Aware to provide a copy of the vaccination record if obtained from local pharmacy or Health Dept. Verbalized acceptance and understanding.  Covid-19 vaccine status: Completed vaccines  Qualifies for Shingles Vaccine? Yes   Zostavax completed No   Shingrix Completed?: No.    Education has been provided regarding the importance of this vaccine. Patient has been advised to call insurance company to determine out of pocket expense if they have not yet received this vaccine. Advised may also receive vaccine at local pharmacy or Health Dept. Verbalized acceptance and understanding.  Screening Tests Health Maintenance  Topic Date Due   DTaP/Tdap/Td (1 - Tdap) Never done   Zoster Vaccines- Shingrix (1 of 2) Never done   OPHTHALMOLOGY EXAM  08/26/2021   HEMOGLOBIN A1C  01/04/2022   Diabetic kidney evaluation - Urine ACR  02/24/2022   FOOT EXAM  02/24/2022   Medicare Annual Wellness (AWV)  03/25/2022   INFLUENZA VACCINE  06/07/2022   Diabetic kidney evaluation - eGFR measurement  07/05/2022   Pneumonia Vaccine 41+ Years old (2 - PCV) 07/05/2022   COVID-19 Vaccine (5 - 2023-24 season) 07/08/2022   MAMMOGRAM  09/10/2022   COLONOSCOPY (Pts 45-46yr Insurance coverage will need  to be confirmed)  08/19/2027   DEXA SCAN  Completed   Hepatitis C Screening  Completed   HPV VACCINES  Aged Out    Health Maintenance  Health Maintenance Due  Topic Date Due   DTaP/Tdap/Td (1 - Tdap) Never done   Zoster Vaccines- Shingrix (1 of 2) Never done   OPHTHALMOLOGY EXAM  08/26/2021   HEMOGLOBIN A1C  01/04/2022   Diabetic kidney evaluation - Urine ACR  02/24/2022   FOOT EXAM  02/24/2022   Medicare Annual Wellness (AWV)  03/25/2022   INFLUENZA VACCINE  06/07/2022   Diabetic kidney evaluation - eGFR measurement  07/05/2022   Pneumonia Vaccine 75 Years old (2 - PCV) 07/05/2022   COVID-19 Vaccine (5 - 2023-24 season) 07/08/2022   MAMMOGRAM  09/10/2022    Colorectal cancer screening: Type of screening: Colonoscopy. Completed 08/18/2017. Repeat every 10 years  Mammogram status: Completed 09/2022. Repeat every year  Bone Density status: Completed 11/02/2016.   Lung Cancer Screening: (Low Dose CT Chest recommended if Age 75-80years, 30 pack-year currently smoking OR have quit w/in 15years.) does not qualify.   Lung Cancer Screening Referral: no  Additional Screening:  Hepatitis C Screening: does qualify; Completed 02/24/2021  Vision Screening: Recommended annual ophthalmology exams for early detection of glaucoma and other disorders of the  eye. Is the patient up to date with their annual eye exam?  Yes  Who is the provider or what is the name of the office in which the patient attends annual eye exams? [facility] If pt is not established with a provider, would they like to be referred to a provider to establish care? No .   Dental Screening: Recommended annual dental exams for proper oral hygiene  Community Resource Referral / Chronic Care Management: CRR required this visit?  No   CCM required this visit?  No      Plan:     I have personally reviewed and noted the following in the patient's chart:   Medical and social history Use of alcohol, tobacco or  illicit drugs  Current medications and supplements including opioid prescriptions. Patient is not currently taking opioid prescriptions. Functional ability and status Nutritional status Physical activity Advanced directives List of other physicians Hospitalizations, surgeries, and ER visits in previous 12 months Vitals Screenings to include cognitive, depression, and falls Referrals and appointments  In addition, I have reviewed and discussed with patient certain preventive protocols, quality metrics, and best practice recommendations. A written personalized care plan for preventive services as well as general preventive health recommendations were provided to patient.     Kellie Simmering, LPN   [id number]   Nurse Notes: none  Due to this being a virtual visit, the after visit summary with patients personalized plan was offered to patient via mail or my-chart.  Patient would like to access on my-chart

## 2022-10-19 NOTE — Patient Instructions (Signed)
[name] , Thank you for taking time to come for your Medicare Wellness Visit. I appreciate your ongoing commitment to your health goals. Please review the following plan we discussed and let me know if I can assist you in the future.   These are the goals we discussed:  Goals       Patient Stated (pt-stated)      Wants to be more active.      Patient Stated      01/16/2020, wants to get more exercise      Patient Stated      03/25/2021, wants to exercise more      Patient Stated      10/19/2022, wants to exercise more        This is a list of the screening recommended for you and due dates:  Health Maintenance  Topic Date Due   DTaP/Tdap/Td vaccine (1 - Tdap) Never done   Zoster (Shingles) Vaccine (1 of 2) Never done   Eye exam for diabetics  08/26/2021   Hemoglobin A1C  01/04/2022   Yearly kidney health urinalysis for diabetes  02/24/2022   Complete foot exam   02/24/2022   Flu Shot  06/07/2022   Yearly kidney function blood test for diabetes  07/05/2022   Pneumonia Vaccine (2 - PCV) 07/05/2022   COVID-19 Vaccine (5 - 2023-24 season) 07/08/2022   Mammogram  09/10/2022   Medicare Annual Wellness Visit  10/20/2023   Colon Cancer Screening  08/19/2027   DEXA scan (bone density measurement)  Completed   Hepatitis C Screening: USPSTF Recommendation to screen - Ages 53-79 yo.  Completed   HPV Vaccine  Aged Out    Advanced directives: Advance directive discussed with you today. Even though you declined this today please call our office should you change your mind and we can give you the proper paperwork for you to fill out.   Conditions/risks identified: none  Next appointment: Follow up in one year for your annual wellness visit    Preventive Care 65 Years and Older, Female Preventive care refers to lifestyle choices and visits with your health care provider that can promote health and wellness. What does preventive care include? A yearly physical exam. This is also  called an annual well check. Dental exams once or twice a year. Routine eye exams. Ask your health care provider how often you should have your eyes checked. Personal lifestyle choices, including: Daily care of your teeth and gums. Regular physical activity. Eating a healthy diet. Avoiding tobacco and drug use. Limiting alcohol use. Practicing safe sex. Taking low-dose aspirin every day. Taking vitamin and mineral supplements as recommended by your health care provider. What happens during an annual well check? The services and screenings done by your health care provider during your annual well check will depend on your age, overall health, lifestyle risk factors, and family history of disease. Counseling  Your health care provider may ask you questions about your: Alcohol use. Tobacco use. Drug use. Emotional well-being. Home and relationship well-being. Sexual activity. Eating habits. History of falls. Memory and ability to understand (cognition). Work and work Astronomer. Reproductive health. Screening  You may have the following tests or measurements: Height, weight, and BMI. Blood pressure. Lipid and cholesterol levels. These may be checked every 5 years, or more frequently if you are over 104 years old. Skin check. Lung cancer screening. You may have this screening every year starting at age 80 if you have a 30-pack-year history of  smoking and currently smoke or have quit within the past 15 years. Fecal occult blood test (FOBT) of the stool. You may have this test every year starting at age 54. Flexible sigmoidoscopy or colonoscopy. You may have a sigmoidoscopy every 5 years or a colonoscopy every 10 years starting at age 63. Hepatitis C blood test. Hepatitis B blood test. Sexually transmitted disease (STD) testing. Diabetes screening. This is done by checking your blood sugar (glucose) after you have not eaten for a while (fasting). You may have this done every 1-3  years. Bone density scan. This is done to screen for osteoporosis. You may have this done starting at age 64. Mammogram. This may be done every 1-2 years. Talk to your health care provider about how often you should have regular mammograms. Talk with your health care provider about your test results, treatment options, and if necessary, the need for more tests. Vaccines  Your health care provider may recommend certain vaccines, such as: Influenza vaccine. This is recommended every year. Tetanus, diphtheria, and acellular pertussis (Tdap, Td) vaccine. You may need a Td booster every 10 years. Zoster vaccine. You may need this after age 19. Pneumococcal 13-valent conjugate (PCV13) vaccine. One dose is recommended after age 65. Pneumococcal polysaccharide (PPSV23) vaccine. One dose is recommended after age 30. Talk to your health care provider about which screenings and vaccines you need and how often you need them. This information is not intended to replace advice given to you by your health care provider. Make sure you discuss any questions you have with your health care provider. Document Released: 11/20/2015 Document Revised: 07/13/2016 Document Reviewed: 08/25/2015 Elsevier Interactive Patient Education  2017 [place] Prevention in the Home Falls can cause injuries. They can happen to people of all ages. There are many things you can do to make your home safe and to help prevent falls. What can I do on the outside of my home? Regularly fix the edges of walkways and driveways and fix any cracks. Remove anything that might make you trip as you walk through a door, such as a raised step or threshold. Trim any bushes or trees on the path to your home. Use bright outdoor lighting. Clear any walking paths of anything that might make someone trip, such as rocks or tools. Regularly check to see if handrails are loose or broken. Make sure that both sides of any steps have  handrails. Any raised decks and porches should have guardrails on the edges. Have any leaves, snow, or ice cleared regularly. Use sand or salt on walking paths during winter. Clean up any spills in your garage right away. This includes oil or grease spills. What can I do in the bathroom? Use night lights. Install grab bars by the toilet and in the tub and shower. Do not use towel bars as grab bars. Use non-skid mats or decals in the tub or shower. If you need to sit down in the shower, use a plastic, non-slip stool. Keep the floor dry. Clean up any water that spills on the floor as soon as it happens. Remove soap buildup in the tub or shower regularly. Attach bath mats securely with double-sided non-slip rug tape. Do not have throw rugs and other things on the floor that can make you trip. What can I do in the bedroom? Use night lights. Make sure that you have a light by your bed that is easy to reach. Do not use any sheets or blankets that  are too big for your bed. They should not hang down onto the floor. Have a firm chair that has side arms. You can use this for support while you get dressed. Do not have throw rugs and other things on the floor that can make you trip. What can I do in the kitchen? Clean up any spills right away. Avoid walking on wet floors. Keep items that you use a lot in easy-to-reach places. If you need to reach something above you, use a strong step stool that has a grab bar. Keep electrical cords out of the way. Do not use floor polish or wax that makes floors slippery. If you must use wax, use non-skid floor wax. Do not have throw rugs and other things on the floor that can make you trip. What can I do with my stairs? Do not leave any items on the stairs. Make sure that there are handrails on both sides of the stairs and use them. Fix handrails that are broken or loose. Make sure that handrails are as long as the stairways. Check any carpeting to make sure  that it is firmly attached to the stairs. Fix any carpet that is loose or worn. Avoid having throw rugs at the top or bottom of the stairs. If you do have throw rugs, attach them to the floor with carpet tape. Make sure that you have a light switch at the top of the stairs and the bottom of the stairs. If you do not have them, ask someone to add them for you. What else can I do to help prevent falls? Wear shoes that: Do not have high heels. Have rubber bottoms. Are comfortable and fit you well. Are closed at the toe. Do not wear sandals. If you use a stepladder: Make sure that it is fully opened. Do not climb a closed stepladder. Make sure that both sides of the stepladder are locked into place. Ask someone to hold it for you, if possible. Clearly mark and make sure that you can see: Any grab bars or handrails. First and last steps. Where the edge of each step is. Use tools that help you move around (mobility aids) if they are needed. These include: Canes. Walkers. Scooters. Crutches. Turn on the lights when you go into a dark area. Replace any light bulbs as soon as they burn out. Set up your furniture so you have a clear path. Avoid moving your furniture around. If any of your floors are uneven, fix them. If there are any pets around you, be aware of where they are. Review your medicines with your doctor. Some medicines can make you feel dizzy. This can increase your chance of falling. Ask your doctor what other things that you can do to help prevent falls. This information is not intended to replace advice given to you by your health care provider. Make sure you discuss any questions you have with your health care provider. Document Released: 08/20/2009 Document Revised: 03/31/2016 Document Reviewed: 11/28/2014 Elsevier Interactive Patient Education  2017 [employer].

## 2022-10-29 ENCOUNTER — Other Ambulatory Visit: Payer: Self-pay | Admitting: Nurse Practitioner

## 2022-11-18 ENCOUNTER — Encounter: Payer: Self-pay | Admitting: Internal Medicine

## 2022-11-28 ENCOUNTER — Ambulatory Visit (INDEPENDENT_AMBULATORY_CARE_PROVIDER_SITE_OTHER): Payer: Medicare Other | Admitting: Nurse Practitioner

## 2022-11-28 ENCOUNTER — Encounter: Payer: Self-pay | Admitting: Nurse Practitioner

## 2022-11-28 VITALS — BP 138/70 | HR 86 | Temp 98.4°F | Ht <= 58 in | Wt 156.0 lb

## 2022-11-28 DIAGNOSIS — E1169 Type 2 diabetes mellitus with other specified complication: Secondary | ICD-10-CM

## 2022-11-28 DIAGNOSIS — N182 Chronic kidney disease, stage 2 (mild): Secondary | ICD-10-CM | POA: Diagnosis not present

## 2022-11-28 DIAGNOSIS — I700 Atherosclerosis of aorta: Secondary | ICD-10-CM | POA: Diagnosis not present

## 2022-11-28 DIAGNOSIS — Z23 Encounter for immunization: Secondary | ICD-10-CM

## 2022-11-28 DIAGNOSIS — E1122 Type 2 diabetes mellitus with diabetic chronic kidney disease: Secondary | ICD-10-CM | POA: Diagnosis not present

## 2022-11-28 DIAGNOSIS — I129 Hypertensive chronic kidney disease with stage 1 through stage 4 chronic kidney disease, or unspecified chronic kidney disease: Secondary | ICD-10-CM

## 2022-11-28 DIAGNOSIS — E6609 Other obesity due to excess calories: Secondary | ICD-10-CM

## 2022-11-28 DIAGNOSIS — Z6832 Body mass index (BMI) 32.0-32.9, adult: Secondary | ICD-10-CM

## 2022-11-28 DIAGNOSIS — E559 Vitamin D deficiency, unspecified: Secondary | ICD-10-CM

## 2022-11-28 MED ORDER — HYDRALAZINE HCL 25 MG PO TABS: 25.0000 mg | ORAL_TABLET | Freq: Every day | ORAL | 1 refills | Status: DC

## 2022-11-28 MED ORDER — METOPROLOL TARTRATE 25 MG PO TABS: 25.0000 mg | ORAL_TABLET | Freq: Every day | ORAL | 1 refills | Status: DC

## 2022-11-28 MED ORDER — AMLODIPINE BESYLATE 5 MG PO TABS: 5.0000 mg | ORAL_TABLET | Freq: Every day | ORAL | 1 refills | Status: DC

## 2022-11-28 MED ORDER — ROSUVASTATIN CALCIUM 10 MG PO TABS: 10.0000 mg | ORAL_TABLET | Freq: Every day | ORAL | 1 refills | Status: DC

## 2022-11-28 NOTE — Progress Notes (Addendum)
I,Tianna Badgett,acting as a Education administrator for [employer], FNP.,have documented all relevant documentation on the behalf of Minette Brine, FNP,as directed by  Minette Brine, FNP while in the presence of Minette Brine, [place].  Subjective:     Patient ID: Anita Carpenter , female    DOB: September 20, 1946 , 77 y.o.   MRN: [id number]   Chief Complaint  Patient presents with   Diabetes    HPI  The patient is here today for a diabetes and blood pressure f/u.  She reports compliance with meds. She denies headaches, chest pain and shortness of breath. She has been caring for her 3 older sisters who have had heart surgery. She has been busy and not been to the provider.   Diabetes She presents for her follow-up diabetic visit. She has type 2 diabetes mellitus. Her disease course has been stable. There are no hypoglycemic associated symptoms. Pertinent negatives for diabetes include no blurred vision and no chest pain. There are no hypoglycemic complications. Diabetic complications include nephropathy. Risk factors for coronary artery disease include tobacco exposure, diabetes mellitus, dyslipidemia, hypertension, post-menopausal and sedentary lifestyle. She participates in exercise intermittently. An ACE inhibitor/angiotensin II receptor blocker is being taken.  Hypertension This is a chronic problem. Pertinent negatives include no blurred vision, chest pain or shortness of breath. Risk factors for coronary artery disease include sedentary lifestyle and diabetes mellitus. The current treatment provides moderate improvement. Compliance problems include exercise.      Past Medical History:  Diagnosis Date   Asthma    Diabetes mellitus without complication (HCC)    High cholesterol    Hypertension      Family History  Problem Relation Age of Onset   Asthma Other    Diabetes Other    Hyperlipidemia Other    Hyperlipidemia Mother    Hypertension Mother    Kidney failure Father    Stroke Father       Current Outpatient Medications:    amLODipine (NORVASC) 5 MG tablet, Take 1 tablet (5 mg total) by mouth daily., Disp: 90 tablet, Rfl: 1   Cholecalciferol (VITAMIN D-3) 125 MCG (5000 UT) TABS, Take 1 tablet by mouth 2 (two) times a week., Disp: , Rfl:    ferrous sulfate 325 (65 FE) MG tablet, Take 1 tablet (325 mg total) by mouth daily with breakfast., Disp: 30 tablet, Rfl: 3   glucose blood test strip, 1 each by Other route daily. Use as instructed, Disp: , Rfl:    hydrALAZINE (APRESOLINE) 25 MG tablet, Take 1 tablet (25 mg total) by mouth daily., Disp: 90 tablet, Rfl: 1   metoprolol tartrate (LOPRESSOR) 25 MG tablet, Take 1 tablet (25 mg total) by mouth daily., Disp: 90 tablet, Rfl: 1   rosuvastatin (CRESTOR) 10 MG tablet, Take 1 tablet (10 mg total) by mouth daily., Disp: 90 tablet, Rfl: 1   Allergies  Allergen Reactions   Doxycycline Rash   Olmesartan Medoxomil-Hctz Anxiety     Review of Systems  Constitutional: Negative.   Eyes:  Negative for blurred vision.  Respiratory: Negative.  Negative for shortness of breath.   Cardiovascular: Negative.  Negative for chest pain.  Gastrointestinal: Negative.   Neurological: Negative.      Today's Vitals   11/28/22 0925  BP: 138/70  Pulse: 86  Temp: 98.4 F (36.9 C)  TempSrc: Oral  Weight: 156 lb (70.8 kg)  Height: 4' 10"$  (1.473 m)   Body mass index is 32.6 kg/m.   Objective:  Physical Exam Vitals  reviewed.  Constitutional:      General: She is not in acute distress.    Appearance: Normal appearance. She is well-developed. She is obese.  Cardiovascular:     Rate and Rhythm: Normal rate and regular rhythm.     Pulses: Normal pulses.     Heart sounds: Normal heart sounds. No murmur heard. Pulmonary:     Effort: Pulmonary effort is normal.     Breath sounds: Normal breath sounds.  Chest:     Chest wall: No tenderness.  Musculoskeletal:        General: Normal range of motion.  Skin:    General: Skin is warm and  dry.     Capillary Refill: Capillary refill takes less than 2 seconds.     Coloration: Skin is not jaundiced.     Findings: No bruising.  Neurological:     General: No focal deficit present.     Mental Status: She is alert and oriented to person, place, and time.     Cranial Nerves: No cranial nerve deficit.     Motor: No weakness.  Psychiatric:        Mood and Affect: Mood normal.        Behavior: Behavior normal.        Thought Content: Thought content normal.        Judgment: Judgment normal.         Assessment And Plan:     1. Diabetes mellitus type 2 in obese [facility]) Comments: She reports having a HgbA1c of 6.7 at diagnosis. She has on tights today unable to check feet. - Hemoglobin A1c - Urine microalbumin-creatinine with uACR - Renal function panel with eGFR  2. Hypertensive nephropathy Comments: Blood pressure is fairly controlled.  Continue current medications - amLODipine (NORVASC) 5 MG tablet; Take 1 tablet (5 mg total) by mouth daily.  Dispense: 90 tablet; Refill: 1 - hydrALAZINE (APRESOLINE) 25 MG tablet; Take 1 tablet (25 mg total) by mouth daily.  Dispense: 90 tablet; Refill: 1 - metoprolol tartrate (LOPRESSOR) 25 MG tablet; Take 1 tablet (25 mg total) by mouth daily.  Dispense: 90 tablet; Refill: 1  3. Chronic kidney disease stage II  4. Vitamin D deficiency Will check vitamin D level and supplement as needed.    Also encouraged to spend 15 minutes in the sun daily.   5. Aortic atherosclerosis ([place]) Comments: Seen on CT in 2020, continue statin.  Tolerating well. - Lipid panel - rosuvastatin (CRESTOR) 10 MG tablet; Take 1 tablet (10 mg total) by mouth daily.  Dispense: 90 tablet; Refill: 1  6. Need for influenza vaccination Influenza vaccine administered Encouraged to take Tylenol as needed for fever or muscle aches. - Flu vaccine HIGH DOSE PF (Fluzone High dose)  7. Encounter for immunization Comments: Prevnar 20 and administered in office. -  Pneumococcal conjugate vaccine 20-valent (Prevnar 20)  8. Class 1 obesity due to excess calories with serious comorbidity and body mass index (BMI) of 32.0 to 32.9 in adult She is encouraged to strive for BMI less than 30 to decrease cardiac risk. Advised to aim for at least 150 minutes of exercise per week.      Patient was given opportunity to ask questions. Patient verbalized understanding of the plan and was able to repeat key elements of the plan. All questions were answered to their satisfaction.  Minette Brine, FNP   I, Minette Brine, FNP, have reviewed all documentation for this visit. The documentation on 11/28/22 for the exam,  diagnosis, procedures, and orders are all accurate and complete.   IF YOU HAVE BEEN REFERRED TO A SPECIALIST, IT MAY TAKE 1-2 WEEKS TO SCHEDULE/PROCESS THE REFERRAL. IF YOU HAVE NOT HEARD FROM US/SPECIALIST IN TWO WEEKS, PLEASE GIVE US A CALL AT [phone] X [phone].   THE PATIENT IS ENCOURAGED TO PRACTICE SOCIAL DISTANCING DUE TO THE COVID-19 PANDEMIC.   

## 2022-11-28 NOTE — Patient Instructions (Addendum)
Diabetes Mellitus and Foot Care Diabetes, also called diabetes mellitus, may cause problems with your feet and legs because of poor blood flow (circulation). Poor circulation may make your skin: Become thinner and drier. Break more easily. Heal more slowly. Peel and crack. You may also have nerve damage (neuropathy). This can cause decreased feeling in your legs and feet. This means that you may not notice minor injuries to your feet that could lead to more serious problems. Finding and treating problems early is the best way to prevent future foot problems. How to care for your feet Foot hygiene  Wash your feet daily with warm water and mild soap. Do not use hot water. Then, pat your feet and the areas between your toes until they are fully dry. Do not soak your feet. This can dry your skin. Trim your toenails straight across. Do not dig under them or around the cuticle. File the edges of your nails with an emery board or nail file. Apply a moisturizing lotion or petroleum jelly to the skin on your feet and to dry, brittle toenails. Use lotion that does not contain alcohol and is unscented. Do not apply lotion between your toes. Shoes and socks Wear clean socks or stockings every day. Make sure they are not too tight. Do not wear knee-high stockings. These may decrease blood flow to your legs. Wear shoes that fit well and have enough cushioning. Always look in your shoes before you put them on to be sure there are no objects inside. To break in new shoes, wear them for just a few hours a day. This prevents injuries on your feet. Wounds, scrapes, corns, and calluses  Check your feet daily for blisters, cuts, bruises, sores, and redness. If you cannot see the bottom of your feet, use a mirror or ask someone for help. Do not cut off corns or calluses or try to remove them with medicine. If you find a minor scrape, cut, or break in the skin on your feet, keep it and the skin around it clean and  dry. You may clean these areas with mild soap and water. Do not clean the area with peroxide, alcohol, or iodine. If you have a wound, scrape, corn, or callus on your foot, look at it several times a day to make sure it is healing and not infected. Check for: Redness, swelling, or pain. Fluid or blood. Warmth. Pus or a bad smell. General tips Do not cross your legs. This may decrease blood flow to your feet. Do not use heating pads or hot water bottles on your feet. They may burn your skin. If you have lost feeling in your feet or legs, you may not know this is happening until it is too late. Protect your feet from hot and cold by wearing shoes, such as at the beach or on hot pavement. Schedule a complete foot exam at least once a year or more often if you have foot problems. Report any cuts, sores, or bruises to your health care provider right away. Where to find more information American Diabetes Association: diabetes.org Association of Diabetes Care & Education Specialists: diabeteseducator.org Contact a health care provider if: You have a condition that increases your risk of infection, and you have any cuts, sores, or bruises on your feet. You have an injury that is not healing. You have redness on your legs or feet. You feel burning or tingling in your legs or feet. You have pain or cramps in your legs   and feet. Your legs or feet are numb. Your feet always feel cold. You have pain around any toenails. Get help right away if: You have a wound, scrape, corn, or callus on your foot and: You have signs of infection. You have a fever. You have a red line going up your leg. This information is not intended to replace advice given to you by your health care provider. Make sure you discuss any questions you have with your health care provider. Document Revised: 04/27/2022 Document Reviewed: 04/27/2022 Elsevier Patient Education  [place].   Pneumococcal Conjugate Vaccine:  What You Need to Know 1. Why get vaccinated? Pneumococcal conjugate vaccine can prevent pneumococcal disease. Pneumococcal disease refers to any illness caused by pneumococcal bacteria. These bacteria can cause many types of illnesses, including pneumonia, which is an infection of the lungs. Pneumococcal bacteria are one of the most common causes of pneumonia. Besides pneumonia, pneumococcal bacteria can also cause: Ear infections Sinus infections Meningitis (infection of the tissue covering the brain and spinal cord) Bacteremia (infection of the blood) Anyone can get pneumococcal disease, but children under 14 years old, people with certain medical conditions or other risk factors, and adults 29 years or older are at the highest risk. Most pneumococcal infections are mild. However, some can result in long-term problems, such as brain damage or hearing loss. Meningitis, bacteremia, and pneumonia caused by pneumococcal disease can be fatal. 2. Pneumococcal conjugate vaccine Pneumococcal conjugate vaccine helps protect against bacteria that cause pneumococcal disease. There are three pneumococcal conjugate vaccines (PCV13, PCV15, and PCV20). The different vaccines are recommended for different people based on age and medical status. Your health care provider can help you determine which type of pneumococcal conjugate vaccine, and how many doses, you should receive. Infants and young children usually need 4 doses of pneumococcal conjugate vaccine. These doses are recommended at 2, 4, 6, and 76-42 months of age. Older children and adolescents might need pneumococcal conjugate vaccine depending on their age and medical conditions or other risk factors if they did not receive the recommended doses as infants or young children. Adults 57 through 99 years old with certain medical conditions or other risk factors who have not already received pneumococcal conjugate vaccine should receive pneumococcal conjugate  vaccine. Adults 65 years or older who have not previously received pneumococcal conjugate vaccine should receive pneumococcal conjugate vaccine. Some people with certain medical conditions are also recommended to receive pneumococcal polysaccharide vaccine (a different type of pneumococcal vaccine known as PPSV23). Some adults who have previously received a pneumococcal conjugate vaccine may be recommended to receive another pneumococcal conjugate vaccine. 3. Talk with your health care provider Tell your vaccination provider if the person getting the vaccine: Has had an allergic reaction after a previous dose of any type of pneumococcal conjugate vaccine (PCV13, PCV15, PCV20, or an earlier pneumococcal conjugate vaccine known as PCV7), or to any vaccine containing diphtheria toxoid (for example, DTaP), or has any severe, life-threatening allergies In some cases, your health care provider may decide to postpone pneumococcal conjugate vaccination until a future visit. People with minor illnesses, such as a cold, may be vaccinated. People who are moderately or severely ill should usually wait until they recover. Your health care provider can give you more information. 4. Risks of a vaccine reaction Redness, swelling, pain, or tenderness where the shot is given, and fever, loss of appetite, fussiness (irritability), feeling tired, headache, muscle aches, joint pain, and chills can happen after pneumococcal conjugate vaccination.  Young children may be at increased risk for seizures caused by fever after a pneumococcal conjugate vaccine if it is administered at the same time as inactivated influenza vaccine. Ask your health care provider for more information. People sometimes faint after medical procedures, including vaccination. Tell your provider if you feel dizzy or have vision changes or ringing in the ears. As with any medicine, there is a very remote chance of a vaccine causing a severe allergic  reaction, other serious injury, or death. 5. What if there is a serious problem? An allergic reaction could occur after the vaccinated person leaves the clinic. If you see signs of a severe allergic reaction (hives, swelling of the face and throat, difficulty breathing, a fast heartbeat, dizziness, or weakness), call 9-1-1 and get the person to the nearest hospital. For other signs that concern you, call your health care provider. Adverse reactions should be reported to the Vaccine Adverse Event Reporting System (VAERS). Your health care provider will usually file this report, or you can do it yourself. Visit the VAERS website at www.vaers.LAgents.no or call 303-711-3901. VAERS is only for reporting reactions, and VAERS staff members do not give medical advice. 6. The National Vaccine Injury Compensation Program The [employer] Vaccine Injury Compensation Program (VICP) is a federal program that was created to compensate people who may have been injured by certain vaccines. Claims regarding alleged injury or death due to vaccination have a time limit for filing, which may be as short as two years. Visit the VICP website at SpiritualWord.at or call 724 408 7311 to learn about the program and about filing a claim. 7. How can I learn more? Ask your health care provider. Call your local or state health department. Visit the website of the Food and Drug Administration (FDA) for vaccine package inserts and additional information at FinderList.no. Contact the Centers for Disease Control and Prevention (CDC): Call 228 810 7008 (1-800-CDC-INFO) or Visit CDC's website at PicCapture.uy. Source: CDC Vaccine Information Statement (Interim) Pneumococcal Conjugate Vaccine (03/18/2022) This same material is available at FootballExhibition.com.br for no charge. This information is not intended to replace advice given to you by your health care provider. Make sure you discuss  any questions you have with your health care provider. Document Revised: 05/06/2022 Document Reviewed: 05/06/2022 Elsevier Patient Education  2023 Else[employer] Inc. Influenza (Flu) Vaccine (Inactivated or Recombinant): What You Need to Know 1. Why get vaccinated? Influenza vaccine can prevent influenza (flu). Flu is a contagious disease that spreads around the [place] every year, usually between October and May. Anyone can get the flu, but it is more dangerous for some people. Infants and young children, people 27 years and older, pregnant people, and people with certain health conditions or a weakened immune system are at greatest risk of flu complications. Pneumonia, bronchitis, sinus infections, and ear infections are examples of flu-related complications. If you have a medical condition, such as heart disease, cancer, or diabetes, flu can make it worse. Flu can cause fever and chills, sore throat, muscle aches, fatigue, cough, headache, and runny or stuffy nose. Some people may have vomiting and diarrhea, though this is more common in children than adults. In an average year, thousands of people in the [place] die from flu, and many more are hospitalized. Flu vaccine prevents millions of illnesses and flu-related visits to the doctor each year. 2. Influenza vaccines CDC recommends everyone 6 months and older get vaccinated every flu season. Children 6 months through 84 years of age may need 2 doses during  a single flu season. Everyone else needs only 1 dose each flu season. It takes about 2 weeks for protection to develop after vaccination. There are many flu viruses, and they are always changing. Each year a new flu vaccine is made to protect against the influenza viruses believed to be likely to cause disease in the upcoming flu season. Even when the vaccine doesn't exactly match these viruses, it may still provide some protection. Influenza vaccine does not cause flu. Influenza vaccine  may be given at the same time as other vaccines. 3. Talk with your health care provider Tell your vaccination provider if the person getting the vaccine: Has had an allergic reaction after a previous dose of influenza vaccine, or has any severe, life-threatening allergies Has ever had Guillain-Barr Syndrome (also called "GBS") In some cases, your health care provider may decide to postpone influenza vaccination until a future visit. Influenza vaccine can be administered at any time during pregnancy. People who are or will be pregnant during influenza season should receive inactivated influenza vaccine. People with minor illnesses, such as a cold, may be vaccinated. People who are moderately or severely ill should usually wait until they recover before getting influenza vaccine. Your health care provider can give you more information. 4. Risks of a vaccine reaction Soreness, redness, and swelling where the shot is given, fever, muscle aches, and headache can happen after influenza vaccination. There may be a very small increased risk of Guillain-Barr Syndrome (GBS) after inactivated influenza vaccine (the flu shot). Young children who get the flu shot along with pneumococcal vaccine (PCV13) and/or DTaP vaccine at the same time might be slightly more likely to have a seizure caused by fever. Tell your health care provider if a child who is getting flu vaccine has ever had a seizure. People sometimes faint after medical procedures, including vaccination. Tell your provider if you feel dizzy or have vision changes or ringing in the ears. As with any medicine, there is a very remote chance of a vaccine causing a severe allergic reaction, other serious injury, or death. 5. What if there is a serious problem? An allergic reaction could occur after the vaccinated person leaves the clinic. If you see signs of a severe allergic reaction (hives, swelling of the face and throat, difficulty breathing, a fast  heartbeat, dizziness, or weakness), call 9-1-1 and get the person to the nearest hospital. For other signs that concern you, call your health care provider. Adverse reactions should be reported to the Vaccine Adverse Event Reporting System (VAERS). Your health care provider will usually file this report, or you can do it yourself. Visit the VAERS website at www.vaers.SamedayNews.es or call (340)742-7840. VAERS is only for reporting reactions, and VAERS staff members do not give medical advice. 6. The National Vaccine Injury Compensation Program The [employer] Vaccine Injury Compensation Program (VICP) is a federal program that was created to compensate people who may have been injured by certain vaccines. Claims regarding alleged injury or death due to vaccination have a time limit for filing, which may be as short as two years. Visit the VICP website at GoldCloset.com.ee or call 340-005-9972 to learn about the program and about filing a claim. 7. How can I learn more? Ask your health care provider. Call your local or state health department. Visit the website of the Food and Drug Administration (FDA) for vaccine package inserts and additional information at TraderRating.uy. Contact the Centers for Disease Control and Prevention (CDC): Call (781) 633-7378 (1-800-CDC-INFO) or Visit CDC's  website at BiotechRoom.com.cy. Source: CDC Vaccine Information Statement Inactivated Influenza Vaccine (06/12/2020) This same material is available at FootballExhibition.com.br for no charge. This information is not intended to replace advice given to you by your health care provider. Make sure you discuss any questions you have with your health care provider. Document Revised: 09/21/2021 Document Reviewed: 07/15/2021 Elsevier Patient Education  2023 [employer].

## 2022-11-29 LAB — RENAL FUNCTION PANEL
Albumin: 4.7 g/dL (ref 3.8–4.8)
BUN/Creatinine Ratio: 16 (ref 12–28)
BUN: 16 mg/dL (ref 8–27)
CO2: 22 mmol/L (ref 20–29)
Calcium: 10 mg/dL (ref 8.7–10.3)
Chloride: 103 mmol/L (ref 96–106)
Creatinine, Ser: 1 mg/dL (ref 0.57–1.00)
Glucose: 115 mg/dL — ABNORMAL HIGH (ref 70–99)
Phosphorus: 3.6 mg/dL (ref 3.0–4.3)
Potassium: 4.4 mmol/L (ref 3.5–5.2)
Sodium: 142 mmol/L (ref 134–144)
eGFR: 59 mL/min/{1.73_m2} — ABNORMAL LOW (ref >59)

## 2022-11-29 LAB — MICROALBUMIN / CREATININE URINE RATIO
Creatinine, Urine: 113.8 mg/dL
Microalb/Creat Ratio: 8 mg/g creat (ref 0–29)
Microalbumin, Urine: 9.3 ug/mL

## 2022-11-29 LAB — HEMOGLOBIN A1C
Est. average glucose Bld gHb Est-mCnc: 143 mg/dL
Hgb A1c MFr Bld: 6.6 % — ABNORMAL HIGH (ref 4.8–5.6)

## 2022-11-29 LAB — LIPID PANEL
Chol/HDL Ratio: 2.3 ratio (ref 0.0–4.4)
Cholesterol, Total: 161 mg/dL (ref 100–199)
HDL: 71 mg/dL (ref >39)
LDL Chol Calc (NIH): 74 mg/dL (ref 0–99)
Triglycerides: 84 mg/dL (ref 0–149)
VLDL Cholesterol Cal: 16 mg/dL (ref 5–40)

## 2022-12-02 ENCOUNTER — Encounter: Payer: Self-pay | Admitting: Nurse Practitioner

## 2023-03-29 ENCOUNTER — Encounter: Payer: Self-pay | Admitting: Nurse Practitioner

## 2023-03-29 ENCOUNTER — Ambulatory Visit (INDEPENDENT_AMBULATORY_CARE_PROVIDER_SITE_OTHER): Payer: Medicare Other | Admitting: Nurse Practitioner

## 2023-03-29 VITALS — BP 120/64 | HR 75 | Temp 99.0°F | Ht <= 58 in | Wt 151.8 lb

## 2023-03-29 DIAGNOSIS — M7989 Other specified soft tissue disorders: Secondary | ICD-10-CM

## 2023-03-29 DIAGNOSIS — Z0000 Encounter for general adult medical examination without abnormal findings: Secondary | ICD-10-CM | POA: Diagnosis not present

## 2023-03-29 DIAGNOSIS — R233 Spontaneous ecchymoses: Secondary | ICD-10-CM

## 2023-03-29 DIAGNOSIS — I129 Hypertensive chronic kidney disease with stage 1 through stage 4 chronic kidney disease, or unspecified chronic kidney disease: Secondary | ICD-10-CM | POA: Diagnosis not present

## 2023-03-29 DIAGNOSIS — I700 Atherosclerosis of aorta: Secondary | ICD-10-CM | POA: Diagnosis not present

## 2023-03-29 DIAGNOSIS — E1122 Type 2 diabetes mellitus with diabetic chronic kidney disease: Secondary | ICD-10-CM | POA: Diagnosis not present

## 2023-03-29 DIAGNOSIS — R21 Rash and other nonspecific skin eruption: Secondary | ICD-10-CM

## 2023-03-29 DIAGNOSIS — E559 Vitamin D deficiency, unspecified: Secondary | ICD-10-CM

## 2023-03-29 DIAGNOSIS — Z79899 Other long term (current) drug therapy: Secondary | ICD-10-CM

## 2023-03-29 DIAGNOSIS — N1831 Chronic kidney disease, stage 3a: Secondary | ICD-10-CM | POA: Diagnosis not present

## 2023-03-29 DIAGNOSIS — E6609 Other obesity due to excess calories: Secondary | ICD-10-CM

## 2023-03-29 DIAGNOSIS — Z6831 Body mass index (BMI) 31.0-31.9, adult: Secondary | ICD-10-CM

## 2023-03-29 DIAGNOSIS — Z23 Encounter for immunization: Secondary | ICD-10-CM

## 2023-03-29 LAB — POCT URINALYSIS DIP (CLINITEK)
Bilirubin, UA: NEGATIVE
Blood, UA: NEGATIVE
Glucose, UA: NEGATIVE mg/dL
Ketones, POC UA: NEGATIVE mg/dL
Leukocytes, UA: NEGATIVE
Nitrite, UA: NEGATIVE
POC PROTEIN,UA: NEGATIVE
Spec Grav, UA: 1.025 (ref 1.010–1.025)
Urobilinogen, UA: 0.2 E.U./dL
pH, UA: 5.5 (ref 5.0–8.0)

## 2023-03-29 MED ORDER — NYSTATIN 100000 UNIT/GM EX POWD
1.0000 | Freq: Three times a day (TID) | CUTANEOUS | 0 refills | Status: AC
Start: 2023-03-29 — End: ?

## 2023-03-29 NOTE — Progress Notes (Signed)
Hershal Coria Martin,acting as a Neurosurgeon for [name], FNP.,have documented all relevant documentation on the behalf of [name], FNP,as directed by  [name], FNP while in the presence of [name], FNP.   Subjective:     Patient ID: Anita Carpenter , female    DOB: March 04, 1946 , 77 y.o.   MRN: [id number]   Chief Complaint  Patient presents with   Annual Exam    HPI  Patient presents today for HM, Patient states compliance with medications and has no concerns today.  BP Readings from Last 3 Encounters: 03/29/23 : 120/64 11/28/22 : 138/70 08/24/21 : 124/82       Past Medical History:  Diagnosis Date   Asthma    Diabetes mellitus without complication (HCC)    High cholesterol    Hypertension      Family History  Problem Relation Age of Onset   Asthma Other    Diabetes Other    Hyperlipidemia Other    Hyperlipidemia Mother    Hypertension Mother    Kidney failure Father    Stroke Father      Current Outpatient Medications:    amLODipine (NORVASC) 5 MG tablet, Take 1 tablet (5 mg total) by mouth daily., Disp: 90 tablet, Rfl: 1   Cholecalciferol (VITAMIN D-3) 125 MCG (5000 UT) TABS, Take 1 tablet by mouth 2 (two) times a week., Disp: , Rfl:    ferrous sulfate 325 (65 FE) MG tablet, Take 1 tablet (325 mg total) by mouth daily with breakfast., Disp: 30 tablet, Rfl: 3   glucose blood test strip, 1 each by Other route daily. Use as instructed, Disp: , Rfl:    hydrALAZINE (APRESOLINE) 25 MG tablet, Take 1 tablet (25 mg total) by mouth daily., Disp: 90 tablet, Rfl: 1   metoprolol tartrate (LOPRESSOR) 25 MG tablet, Take 1 tablet (25 mg total) by mouth daily., Disp: 90 tablet, Rfl: 1   nystatin powder, Apply 1 Application topically 3 (three) times daily., Disp: 15 g, Rfl: 0   rosuvastatin (CRESTOR) 10 MG tablet, Take 1 tablet (10 mg total) by mouth daily., Disp: 90 tablet, Rfl: 1   Allergies  Allergen Reactions   Doxycycline Rash   Olmesartan Medoxomil-Hctz  Anxiety      The patient states she is post menopausal status.   No LMP recorded. Patient is postmenopausal.  Negative for Dysmenorrhea and Negative for Menorrhagia. Negative for: breast discharge, breast lump(s), breast pain and breast self exam. Associated symptoms include abnormal vaginal bleeding. Pertinent negatives include abnormal bleeding (hematology), anxiety, decreased libido, depression, difficulty falling sleep, dyspareunia, history of infertility, nocturia, sexual dysfunction, sleep disturbances, urinary incontinence, urinary urgency, vaginal discharge and vaginal itching. Diet regular; she tries not to eat sugary drinks also tries to eat vegetables, fruits and nuts, she tries to eat a balanced healthy diet. The patient states her exercise level is  -she has planted her garden in the mornings.   The patient's tobacco use is:  Social History   Tobacco Use  Smoking Status Former   Packs/day: 0.25   Years: 50.00   Additional pack years: 0.00   Total pack years: 12.50   Types: Cigarettes   Start date: 51   Quit date: 12/03/2018   Years since quitting: 4.3  Smokeless Tobacco Never  Tobacco Comments   1ppdx   She has been exposed to passive smoke. The patient's alcohol use is:  Social History   Substance and Sexual Activity  Alcohol Use Not Currently  Review of Systems  Constitutional: Negative.   HENT: Negative.    Eyes: Negative.   Respiratory: Negative.    Cardiovascular: Negative.   Gastrointestinal: Negative.   Endocrine: Negative.   Genitourinary: Negative.   Musculoskeletal: Negative.   Skin: Negative.   Allergic/Immunologic: Negative.   Neurological: Negative.   Hematological: Negative.   Psychiatric/Behavioral: Negative.       Today's Vitals   03/29/23 1133  BP: 120/64  Pulse: 75  Temp: 99 F (37.2 C)  TempSrc: Oral  Weight: 151 lb 12.8 oz (68.9 kg)  Height: 4\' 10"  (1.473 m)  PainSc: 0-No pain   Body mass index is 31.73 kg/m.  Wt Readings  from Last 3 Encounters:  03/29/23 151 lb 12.8 oz (68.9 kg)  11/28/22 156 lb (70.8 kg)  10/19/22 152 lb (68.9 kg)    Objective:  Physical Exam Vitals reviewed.  Constitutional:      General: She is not in acute distress.    Appearance: Normal appearance. She is well-developed. She is obese.  HENT:     Head: Normocephalic and atraumatic.     Right Ear: Hearing, tympanic membrane, ear canal and external ear normal. There is no impacted cerumen.     Left Ear: Hearing, tympanic membrane, ear canal and external ear normal. There is no impacted cerumen.     Nose:     Comments: Deferred - masked    Mouth/Throat:     Comments: Deferred - masked Eyes:     General: Lids are normal.     Extraocular Movements: Extraocular movements intact.     Conjunctiva/sclera: Conjunctivae normal.     Pupils: Pupils are equal, round, and reactive to light.     Funduscopic exam:    Right eye: No papilledema.        Left eye: No papilledema.  Neck:     Thyroid: No thyroid mass.     Vascular: No carotid bruit.  Cardiovascular:     Rate and Rhythm: Normal rate and regular rhythm.     Pulses: Normal pulses.     Heart sounds: Normal heart sounds. No murmur heard. Pulmonary:     Effort: Pulmonary effort is normal.     Breath sounds: Normal breath sounds.  Chest:     Chest wall: No mass.  Breasts:    Tanner Score is 5.     Right: Normal. No mass or tenderness.     Left: Normal. No mass or tenderness.  Abdominal:     General: Abdomen is flat. Bowel sounds are normal. There is no distension.     Palpations: Abdomen is soft.     Tenderness: There is no abdominal tenderness.  Genitourinary:    Rectum: Guaiac result negative.  Musculoskeletal:        General: No swelling. Normal range of motion.     Cervical back: Full passive range of motion without pain, normal range of motion and neck supple.     Right lower leg: No edema.     Left lower leg: No edema.  Lymphadenopathy:     Upper Body:     Right  upper body: No supraclavicular, axillary or pectoral adenopathy.     Left upper body: No supraclavicular, axillary or pectoral adenopathy.  Skin:    General: Skin is warm and dry.     Capillary Refill: Capillary refill takes less than 2 seconds.     Findings: Rash (underneath both breast with redness under left and left underneath pannus) present.  Neurological:  General: No focal deficit present.     Mental Status: She is alert and oriented to person, place, and time.     Cranial Nerves: No cranial nerve deficit.     Sensory: No sensory deficit.  Psychiatric:        Mood and Affect: Mood normal.        Behavior: Behavior normal.        Thought Content: Thought content normal.        Judgment: Judgment normal.         Assessment And Plan:     1. Encounter for annual health examination Behavior modifications discussed and diet history reviewed.   Pt will continue to exercise regularly and modify diet with low GI, plant based foods and decrease intake of processed foods.  Recommend intake of daily multivitamin, Vitamin D, and calcium.  Recommend mammogram and colonoscopy for preventive screenings, as well as recommend immunizations that include influenza, TDAP, and Shingles Health Maintenance  Topic Date Due   Zoster (Shingles) Vaccine (1 of 2) Never done   COVID-19 Vaccine (6 - 2023-24 season) 08/23/2022   Flu Shot  06/08/2023   DTaP/Tdap/Td vaccine (2 - Td or Tdap) 08/06/2023   Eye exam for diabetics  09/02/2023   Hemoglobin A1C  09/29/2023   Mammogram  10/04/2023   Medicare Annual Wellness Visit  10/20/2023   Yearly kidney function blood test for diabetes  03/28/2024   Yearly kidney health urinalysis for diabetes  03/28/2024   Complete foot exam   03/28/2024   Colon Cancer Screening  08/19/2027   Pneumonia Vaccine  Completed   DEXA scan (bone density measurement)  Completed   Hepatitis C Screening  Completed   HPV Vaccine  Aged Out    2. Type 2 diabetes mellitus  with stage 3a chronic kidney disease, without long-term current use of insulin (HCC) Comments: Hemoglobin A1c was stable at last visit.  Continue current medications.  Diabetes foot exam done with no abnormal findings. - CMP14+EGFR - Hemoglobin A1c  3. Hypertensive nephropathy Comments: Blood pressure is well-controlled.  Continue current medications.  4. 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  Encouraged to stay well-hydrated with water. - EKG 12-Lead - Microalbumin / creatinine urine ratio - POCT URINALYSIS DIP (CLINITEK)  5. Need for zoster vaccination  6. Class 1 obesity due to excess calories with serious comorbidity and body mass index (BMI) of 31.0 to 31.9 in adult  7. Other long term (current) drug therapy - CBC with Differential/Platelet  8. Vitamin D deficiency  9. Aortic atherosclerosis (HCC) Comments: Continue statin, tolerating well. - Lipid panel  10. Leg swelling Comments: Wear support socks during the day  11. Rash and nonspecific skin eruption Comments: Erythematous rash underneath left breast and left side of pannus.  She is to apply nystatin powder as needed. - nystatin powder; Apply 1 Application topically 3 (three) times daily.  Dispense: 15 g; Refill: 0  12. Abnormal bruising - TSH   Return for 1 year physical; 4 month DM f/u and 2nd shingrix.  Patient was given opportunity to ask questions. Patient verbalized understanding of the plan and was able to repeat key elements of the plan. All questions were answered to their satisfaction.   [name], FNP   I, [name], FNP, have reviewed all documentation for this visit. The documentation on 03/29/23 for the exam, diagnosis, procedures, and orders are all accurate and complete.   THE PATIENT IS  ENCOURAGED TO PRACTICE SOCIAL DISTANCING DUE TO THE COVID-19 PANDEMIC.

## 2023-03-29 NOTE — Patient Instructions (Addendum)
Wear support socks during the day

## 2023-03-30 LAB — MICROALBUMIN / CREATININE URINE RATIO
Creatinine, Urine: 124.5 mg/dL
Microalb/Creat Ratio: 15 mg/g creat (ref 0–29)
Microalbumin, Urine: 18.9 ug/mL

## 2023-03-30 LAB — CBC WITH DIFFERENTIAL/PLATELET
Basophils Absolute: 0 10*3/uL (ref 0.0–0.2)
Basos: 1 %
EOS (ABSOLUTE): 0.1 10*3/uL (ref 0.0–0.4)
Eos: 1 %
Hematocrit: 37.4 % (ref 34.0–46.6)
Hemoglobin: 11.5 g/dL (ref 11.1–15.9)
Immature Grans (Abs): 0 10*3/uL (ref 0.0–0.1)
Immature Granulocytes: 0 %
Lymphocytes Absolute: 1.4 10*3/uL (ref 0.7–3.1)
Lymphs: 28 %
MCH: 23.3 pg — ABNORMAL LOW (ref 26.6–33.0)
MCHC: 30.7 g/dL — ABNORMAL LOW (ref 31.5–35.7)
MCV: 76 fL — ABNORMAL LOW (ref 79–97)
Monocytes Absolute: 0.4 10*3/uL (ref 0.1–0.9)
Monocytes: 8 %
Neutrophils Absolute: 3.2 10*3/uL (ref 1.4–7.0)
Neutrophils: 62 %
Platelets: 335 10*3/uL (ref 150–450)
RBC: 4.93 x10E6/uL (ref 3.77–5.28)
RDW: 14.6 % (ref 11.7–15.4)
WBC: 5.1 10*3/uL (ref 3.4–10.8)

## 2023-03-30 LAB — CMP14+EGFR
ALT: 17 IU/L (ref 0–32)
AST: 21 IU/L (ref 0–40)
Albumin/Globulin Ratio: 1.5 (ref 1.2–2.2)
Albumin: 4.4 g/dL (ref 3.8–4.8)
Alkaline Phosphatase: 87 IU/L (ref 44–121)
BUN/Creatinine Ratio: 19 (ref 12–28)
BUN: 15 mg/dL (ref 8–27)
Bilirubin Total: 0.6 mg/dL (ref 0.0–1.2)
CO2: 21 mmol/L (ref 20–29)
Calcium: 9.9 mg/dL (ref 8.7–10.3)
Chloride: 106 mmol/L (ref 96–106)
Creatinine, Ser: 0.77 mg/dL (ref 0.57–1.00)
Globulin, Total: 2.9 g/dL (ref 1.5–4.5)
Glucose: 99 mg/dL (ref 70–99)
Potassium: 4.1 mmol/L (ref 3.5–5.2)
Sodium: 142 mmol/L (ref 134–144)
Total Protein: 7.3 g/dL (ref 6.0–8.5)
eGFR: 80 mL/min/{1.73_m2} (ref >59)

## 2023-03-30 LAB — HEMOGLOBIN A1C
Est. average glucose Bld gHb Est-mCnc: 143 mg/dL
Hgb A1c MFr Bld: 6.6 % — ABNORMAL HIGH (ref 4.8–5.6)

## 2023-03-30 LAB — LIPID PANEL
Chol/HDL Ratio: 2.2 ratio (ref 0.0–4.4)
Cholesterol, Total: 150 mg/dL (ref 100–199)
HDL: 68 mg/dL (ref >39)
LDL Chol Calc (NIH): 65 mg/dL (ref 0–99)
Triglycerides: 91 mg/dL (ref 0–149)
VLDL Cholesterol Cal: 17 mg/dL (ref 5–40)

## 2023-03-30 LAB — TSH: TSH: 1.25 u[IU]/mL (ref 0.450–4.500)

## 2023-06-03 ENCOUNTER — Other Ambulatory Visit: Payer: Self-pay | Admitting: Nurse Practitioner

## 2023-06-03 DIAGNOSIS — I700 Atherosclerosis of aorta: Secondary | ICD-10-CM

## 2023-06-03 DIAGNOSIS — I129 Hypertensive chronic kidney disease with stage 1 through stage 4 chronic kidney disease, or unspecified chronic kidney disease: Secondary | ICD-10-CM

## 2023-07-31 NOTE — Progress Notes (Signed)
Anita Carpenter, CMA,acting as a Neurosurgeon for Anita Felts, FNP.,have documented all relevant documentation on the behalf of Anita Felts, FNP,as directed by  Anita Felts, FNP while in the presence of Anita Felts, FNP.  Subjective:  Patient ID: Anita Carpenter , female    DOB: 1946-10-13 , 77 y.o.   MRN: [id number]  Chief Complaint  Patient presents with   Hypertension   Diabetes    HPI  Patient presents today for BP, and DM. Patient reports compliance with medication. Patient denies any chest pain, SOB, or headaches. Patient has no concerns today. She is not taking a medication for diabetes. She had been exposed to covid at the end of August but did not get it.   BP Readings from Last 3 Encounters: 08/01/23 : 130/80 03/29/23 : 120/64 11/28/22 : 138/70   Wt Readings from Last 3 Encounters: 08/01/23 : 152 lb 9.6 oz (69.2 kg) 03/29/23 : 151 lb 12.8 oz (68.9 kg) 11/28/22 : 156 lb (70.8 kg)    Diabetes She presents for her follow-up diabetic visit. She has type 2 diabetes mellitus. Her disease course has been stable. There are no hypoglycemic associated symptoms. Pertinent negatives for diabetes include no blurred vision and no chest pain. There are no hypoglycemic complications. Diabetic complications include heart disease and nephropathy. Risk factors for coronary artery disease include tobacco exposure, diabetes mellitus, dyslipidemia, hypertension, post-menopausal and sedentary lifestyle. Current diabetic treatment includes oral agent (monotherapy). Her weight is stable. She is following a generally unhealthy diet. She has not had a previous visit with a dietitian. She participates in exercise intermittently. (Blood sugar averages 109) An ACE inhibitor/angiotensin II receptor blocker is being taken.  Hypertension This is a chronic problem. Pertinent negatives include no blurred vision, chest pain or shortness of breath. Risk factors for coronary artery disease include sedentary lifestyle  and diabetes mellitus. The current treatment provides moderate improvement. Compliance problems include exercise.      Past Medical History:  Diagnosis Date   Asthma    Diabetes mellitus without complication (HCC)    High cholesterol    Hypertension      Family History  Problem Relation Age of Onset   Asthma Other    Diabetes Other    Hyperlipidemia Other    Hyperlipidemia Mother    Hypertension Mother    Kidney failure Father    Stroke Father      Current Outpatient Medications:    amLODipine (NORVASC) 5 MG tablet, TAKE 1 TABLET (5 MG TOTAL) BY MOUTH DAILY., Disp: 90 tablet, Rfl: 1   Cholecalciferol (VITAMIN D-3) 125 MCG (5000 UT) TABS, Take 1 tablet by mouth 2 (two) times a week., Disp: , Rfl:    ferrous sulfate 325 (65 FE) MG tablet, Take 1 tablet (325 mg total) by mouth daily with breakfast., Disp: 30 tablet, Rfl: 3   glucose blood test strip, 1 each by Other route daily. Use as instructed, Disp: , Rfl:    hydrALAZINE (APRESOLINE) 25 MG tablet, TAKE 1 TABLET (25 MG TOTAL) BY MOUTH DAILY., Disp: 90 tablet, Rfl: 1   metoprolol tartrate (LOPRESSOR) 25 MG tablet, TAKE 1 TABLET (25 MG TOTAL) BY MOUTH DAILY., Disp: 90 tablet, Rfl: 1   nystatin powder, Apply 1 Application topically 3 (three) times daily., Disp: 15 g, Rfl: 0   rosuvastatin (CRESTOR) 10 MG tablet, TAKE 1 TABLET BY MOUTH EVERY DAY, Disp: 90 tablet, Rfl: 1   Allergies  Allergen Reactions   Doxycycline Rash   Olmesartan Medoxomil-Hctz  Anxiety     Review of Systems  Constitutional: Negative.   HENT: Negative.    Eyes: Negative.  Negative for blurred vision.  Respiratory: Negative.  Negative for shortness of breath.   Cardiovascular: Negative.  Negative for chest pain.  Gastrointestinal: Negative.   Neurological: Negative.   Psychiatric/Behavioral: Negative.       Today's Vitals   08/01/23 1028 08/01/23 1112  BP: 130/80 120/70  Pulse: 75   Temp: 98.4 F (36.9 C)   TempSrc: Oral   Weight: 152 lb 9.6 oz  (69.2 kg)   Height: 4\' 8"  (1.422 m)   PainSc: 0-No pain    Body mass index is 34.21 kg/m.  Wt Readings from Last 3 Encounters:  08/01/23 152 lb 9.6 oz (69.2 kg)  03/29/23 151 lb 12.8 oz (68.9 kg)  11/28/22 156 lb (70.8 kg)     Objective:  Physical Exam Vitals reviewed.  Constitutional:      General: She is not in acute distress.    Appearance: Normal appearance. She is well-developed. She is obese.  Cardiovascular:     Rate and Rhythm: Normal rate and regular rhythm.     Pulses: Normal pulses.     Heart sounds: Normal heart sounds. No murmur heard. Pulmonary:     Effort: Pulmonary effort is normal. No respiratory distress.     Breath sounds: Normal breath sounds. No wheezing.  Chest:     Chest wall: No tenderness.  Skin:    General: Skin is warm and dry.     Capillary Refill: Capillary refill takes less than 2 seconds.     Coloration: Skin is not jaundiced.     Findings: No bruising.  Neurological:     General: No focal deficit present.     Mental Status: She is alert and oriented to person, place, and time.     Cranial Nerves: No cranial nerve deficit.     Motor: No weakness.  Psychiatric:        Mood and Affect: Mood normal.        Behavior: Behavior normal.        Thought Content: Thought content normal.        Judgment: Judgment normal.         Assessment And Plan:  Type 2 diabetes mellitus with stage 3a chronic kidney disease, without long-term current use of insulin (HCC) Assessment & Plan: HgbA1c has been stable at 6.6 pending results will consider medication treatment pending labs  Orders: -     Hemoglobin A1c -     BMP8+eGFR -     Amb Referral To Provider Referral Exercise Program (P.R.E.P)  Hypertensive nephropathy Assessment & Plan: Blood pressure is well controlled. Continue current medications  Orders: -     Hemoglobin A1c -     BMP8+eGFR -     Amb Referral To Provider Referral Exercise Program (P.R.E.P)  Aortic atherosclerosis  (HCC) Assessment & Plan: Continue statin, tolerating well   Need for influenza vaccination Assessment & Plan: Influenza vaccine administered Encouraged to take Tylenol as needed for fever or muscle aches.   Orders: -     Flu Vaccine Trivalent High Dose (Fluad)  Need for shingles vaccine Assessment & Plan: Rx sent to pharmacy  Orders: -     Zoster Vac Recomb Adjuvanted; Inject 0.5 mLs into the muscle once for 1 dose. Repeat in 2-6 months  Dispense: 0.5 mL; Refill: 1  Class 1 obesity due to excess calories with body mass index (BMI)  of 34.0 to 34.9 in adult, unspecified whether serious comorbidity present Assessment & Plan: She is encouraged to strive for BMI less than 30 to decrease cardiac risk. Advised to aim for at least 150 minutes of exercise per week.      Return for controlled DM check 4 months.  Patient was given opportunity to ask questions. Patient verbalized understanding of the plan and was able to repeat key elements of the plan. All questions were answered to their satisfaction.    Jeanell Sparrow, FNP, have reviewed all documentation for this visit. The documentation on 08/01/23 for the exam, diagnosis, procedures, and orders are all accurate and complete.   IF YOU HAVE BEEN REFERRED TO A SPECIALIST, IT MAY TAKE 1-2 WEEKS TO SCHEDULE/PROCESS THE REFERRAL. IF YOU HAVE NOT HEARD FROM US/SPECIALIST IN TWO WEEKS, PLEASE GIVE [place] A CALL AT [phone].

## 2023-08-01 ENCOUNTER — Ambulatory Visit (INDEPENDENT_AMBULATORY_CARE_PROVIDER_SITE_OTHER): Payer: Medicare Other | Admitting: Nurse Practitioner

## 2023-08-01 ENCOUNTER — Encounter: Payer: Self-pay | Admitting: Nurse Practitioner

## 2023-08-01 VITALS — BP 120/70 | HR 75 | Temp 98.4°F | Ht <= 58 in | Wt 152.6 lb

## 2023-08-01 DIAGNOSIS — Z6834 Body mass index (BMI) 34.0-34.9, adult: Secondary | ICD-10-CM

## 2023-08-01 DIAGNOSIS — Z23 Encounter for immunization: Secondary | ICD-10-CM

## 2023-08-01 DIAGNOSIS — N1831 Chronic kidney disease, stage 3a: Secondary | ICD-10-CM

## 2023-08-01 DIAGNOSIS — E6609 Other obesity due to excess calories: Secondary | ICD-10-CM

## 2023-08-01 DIAGNOSIS — E1122 Type 2 diabetes mellitus with diabetic chronic kidney disease: Secondary | ICD-10-CM | POA: Diagnosis not present

## 2023-08-01 DIAGNOSIS — I700 Atherosclerosis of aorta: Secondary | ICD-10-CM | POA: Diagnosis not present

## 2023-08-01 DIAGNOSIS — I129 Hypertensive chronic kidney disease with stage 1 through stage 4 chronic kidney disease, or unspecified chronic kidney disease: Secondary | ICD-10-CM | POA: Diagnosis not present

## 2023-08-01 LAB — HEMOGLOBIN A1C
Est. average glucose Bld gHb Est-mCnc: 140 mg/dL
Hgb A1c MFr Bld: 6.5 % — ABNORMAL HIGH (ref 4.8–5.6)

## 2023-08-01 LAB — BMP8+EGFR
BUN/Creatinine Ratio: 17 (ref 12–28)
BUN: 17 mg/dL (ref 8–27)
CO2: 23 mmol/L (ref 20–29)
Calcium: 10 mg/dL (ref 8.7–10.3)
Chloride: 104 mmol/L (ref 96–106)
Creatinine, Ser: 0.98 mg/dL (ref 0.57–1.00)
Glucose: 106 mg/dL — ABNORMAL HIGH (ref 70–99)
Potassium: 4.8 mmol/L (ref 3.5–5.2)
Sodium: 140 mmol/L (ref 134–144)
eGFR: 60 mL/min/{1.73_m2} (ref >59)

## 2023-08-01 MED ORDER — ZOSTER VAC RECOMB ADJUVANTED 50 MCG/0.5ML IM SUSR
0.5000 mL | Freq: Once | INTRAMUSCULAR | 1 refills | Status: AC
Start: 2023-08-01 — End: 2023-08-01

## 2023-08-01 NOTE — Assessment & Plan Note (Addendum)
HgbA1c has been stable at 6.6 pending results will consider medication treatment pending labs

## 2023-08-01 NOTE — Patient Instructions (Signed)
Check on RSV and Shingles shot at the pharmacy  Call office for new covid shot in a few days.

## 2023-08-16 DIAGNOSIS — Z23 Encounter for immunization: Secondary | ICD-10-CM | POA: Insufficient documentation

## 2023-08-16 NOTE — Assessment & Plan Note (Signed)
Rx sent to pharmacy   

## 2023-08-16 NOTE — Assessment & Plan Note (Signed)
Blood pressure is well controlled. Continue current medications. 

## 2023-08-16 NOTE — Assessment & Plan Note (Signed)
Continue statin, tolerating well 

## 2023-08-16 NOTE — Assessment & Plan Note (Signed)
She is encouraged to strive for BMI less than 30 to decrease cardiac risk. Advised to aim for at least 150 minutes of exercise per week.  

## 2023-08-16 NOTE — Assessment & Plan Note (Signed)
Influenza vaccine administered Encouraged to take Tylenol as needed for fever or muscle aches.

## 2023-09-07 LAB — HM DIABETES EYE EXAM

## 2023-10-09 LAB — HM MAMMOGRAPHY

## 2023-10-11 ENCOUNTER — Encounter: Payer: Self-pay | Admitting: Internal Medicine

## 2023-11-22 ENCOUNTER — Ambulatory Visit: Payer: Medicare Other

## 2023-11-22 DIAGNOSIS — Z0000 Encounter for general adult medical examination without abnormal findings: Secondary | ICD-10-CM | POA: Diagnosis not present

## 2023-11-22 NOTE — Patient Instructions (Signed)
 Anita Carpenter , Thank you for taking time to come for your Medicare Wellness Visit. I appreciate your ongoing commitment to your health goals. Please review the following plan we discussed and let me know if I can assist you in the future.   Referrals/Orders/Follow-Ups/Clinician Recommendations: none  This is a list of the screening recommended for you and due dates:  Health Maintenance  Topic Date Due   Zoster (Shingles) Vaccine (1 of 2) Never done   COVID-19 Vaccine (6 - 2024-25 season) 07/09/2023   DTaP/Tdap/Td vaccine (2 - Td or Tdap) 08/06/2023   Eye exam for diabetics  09/02/2023   Hemoglobin A1C  01/29/2024   Yearly kidney health urinalysis for diabetes  03/28/2024   Complete foot exam   03/28/2024   Yearly kidney function blood test for diabetes  07/31/2024   Mammogram  10/08/2024   Medicare Annual Wellness Visit  11/21/2024   Pneumonia Vaccine  Completed   Flu Shot  Completed   DEXA scan (bone density measurement)  Completed   Hepatitis C Screening  Completed   HPV Vaccine  Aged Out   Colon Cancer Screening  Discontinued    Advanced directives: (Declined) Advance directive discussed with you today. Even though you declined this today, please call our office should you change your mind, and we can give you the proper paperwork for you to fill out.  Next Medicare Annual Wellness Visit scheduled for next year: No, office will schedule  insert Preventive Care attachment Insert FALL PREVENTION attachment if needed

## 2023-11-22 NOTE — Progress Notes (Signed)
 Subjective:   Anita Carpenter is a 78 y.o. female who presents for Medicare Annual (Subsequent) preventive examination.  Visit Complete: Virtual I connected with  Anita Carpenter on 11/22/23 by a audio enabled telemedicine application and verified that I am speaking with the correct person using two identifiers.  Interactive audio and video telecommunications were attempted between this provider and patient, however failed, due to patient having technical difficulties OR patient did not have access to video capability.  We continued and completed visit with audio only.  Patient Location: Home  Provider Location: Office/Clinic  I discussed the limitations of evaluation and management by telemedicine. The patient expressed understanding and agreed to proceed.  Vital Signs: Because this visit was a virtual/telehealth visit, some criteria may be missing or patient reported. Any vitals not documented were not able to be obtained and vitals that have been documented are patient reported.  Patient Medicare AWV questionnaire was completed by the patient on 11/20/2023; I have confirmed that all information answered by patient is correct and no changes since this date.  Cardiac Risk Factors include: advanced age (>55men, >34 women);diabetes mellitus     Objective:    Today's Vitals   There is no height or weight on file to calculate BMI.     11/22/2023   11:04 AM 10/19/2022   11:34 AM 03/25/2021   10:34 AM 01/16/2020    2:13 PM 01/09/2019    2:58 PM 06/10/2014   11:11 AM  Advanced Directives  Does Patient Have a Medical Advance Directive? No No No No No Patient does not have advance directive;Patient would not like information  Would patient like information on creating a medical advance directive?     Yes (MAU/Ambulatory/Procedural Areas - Information given)     Current Medications (verified) Outpatient Encounter Medications as of 11/22/2023  Medication Sig   amLODipine  (NORVASC ) 5 MG  tablet TAKE 1 TABLET (5 MG TOTAL) BY MOUTH DAILY.   Cholecalciferol (VITAMIN D -3) 125 MCG (5000 UT) TABS Take 1 tablet by mouth 2 (two) times a week.   ferrous sulfate  325 (65 FE) MG tablet Take 1 tablet (325 mg total) by mouth daily with breakfast.   glucose blood test strip 1 each by Other route daily. Use as instructed   hydrALAZINE  (APRESOLINE ) 25 MG tablet TAKE 1 TABLET (25 MG TOTAL) BY MOUTH DAILY.   metoprolol  tartrate (LOPRESSOR ) 25 MG tablet TAKE 1 TABLET (25 MG TOTAL) BY MOUTH DAILY.   nystatin  powder Apply 1 Application topically 3 (three) times daily.   rosuvastatin  (CRESTOR ) 10 MG tablet TAKE 1 TABLET BY MOUTH EVERY DAY   No facility-administered encounter medications on file as of 11/22/2023.    Allergies (verified) Doxycycline and Olmesartan medoxomil-hctz   History: Past Medical History:  Diagnosis Date   Asthma    Diabetes mellitus without complication (HCC)    Heart murmur 1969   High cholesterol    Hypertension    Past Surgical History:  Procedure Laterality Date   TONSILLECTOMY     TUBAL LIGATION  1985   Family History  Problem Relation Age of Onset   Asthma Other    Diabetes Other    Hyperlipidemia Other    Hyperlipidemia Mother    Hypertension Mother    Kidney failure Father    Stroke Father    Diabetes Daughter    Social History   Socioeconomic History   Marital status: Divorced    Spouse name: Not on file   Number of children:  Not on file   Years of education: Not on file   Highest education level: Not on file  Occupational History   Occupation: retired  Tobacco Use   Smoking status: Former    Current packs/day: 0.00    Average packs/day: 0.3 packs/day for 50.1 years (12.5 ttl pk-yrs)    Types: Cigarettes    Start date: 24    Quit date: 12/03/2018    Years since quitting: 4.9   Smokeless tobacco: Never   Tobacco comments:    1ppdx  Vaping Use   Vaping status: Never Used  Substance and Sexual Activity   Alcohol use: Not Currently    Drug use: Never   Sexual activity: Not Currently    Birth control/protection: Abstinence  Other Topics Concern   Not on file  Social History Narrative   Not on file   Social Drivers of Health   Financial Resource Strain: Low Risk  (11/22/2023)   Overall Financial Resource Strain (CARDIA)    Difficulty of Paying Living Expenses: Not hard at all  Food Insecurity: No Food Insecurity (11/22/2023)   Hunger Vital Sign    Worried About Running Out of Food in the Last Year: Never true    Ran Out of Food in the Last Year: Never true  Transportation Needs: No Transportation Needs (11/22/2023)   PRAPARE - Administrator, Civil Service (Medical): No    Lack of Transportation (Non-Medical): No  Physical Activity: Insufficiently Active (11/22/2023)   Exercise Vital Sign    Days of Exercise per Week: 3 days    Minutes of Exercise per Session: 30 min  Stress: No Stress Concern Present (11/22/2023)   [employer] of Occupational Health - Occupational Stress Questionnaire    Feeling of Stress : Not at all  Social Connections: Moderately Isolated (11/22/2023)   Social Connection and Isolation Panel [NHANES]    Frequency of Communication with Friends and Family: More than three times a week    Frequency of Social Gatherings with Friends and Family: Not on file    Attends Religious Services: More than 4 times per year    Active Member of [employer] or Organizations: No    Attends Engineer, structural: Never    Marital Status: Divorced    Tobacco Counseling Counseling given: Not Answered Tobacco comments: 1ppdx   Clinical Intake:  Pre-visit preparation completed: Yes  Pain : No/denies pain     Nutritional Risks: None Diabetes: Yes CBG done?: No Did pt. bring in CBG monitor from home?: No  How often do you need to have someone help you when you read instructions, pamphlets, or other written materials from your doctor or pharmacy?: 1 - Never  Interpreter Needed?:  No  Information entered by :: NAllen LPN   Activities of Daily Living    11/20/2023   10:35 AM  In your present state of health, do you have any difficulty performing the following activities:  Hearing? 0  Vision? 0  Difficulty concentrating or making decisions? 0  Walking or climbing stairs? 0  Dressing or bathing? 0  Doing errands, shopping? 0  Preparing Food and eating ? N  Using the Toilet? N  In the past six months, have you accidently leaked urine? Y  Do you have problems with loss of bowel control? N  Managing your Medications? N  Managing your Finances? N  Housekeeping or managing your Housekeeping? N    Patient Care Team: [name], FNP as PCP - General (General  Practice)  Indicate any recent Medical Services you may have received from other than [facility] providers in the past year (date may be approximate).     Assessment:   This is a routine wellness examination for Anita Carpenter.  Hearing/Vision screen Hearing Screening - Comments:: Denies hearing issues Vision Screening - Comments:: Regular eye exams, [facility]   Goals Addressed             This Visit's Progress    Patient Stated       11/22/2023, wants to exercise more, walk outside, lose weight       Depression Screen    11/22/2023   11:05 AM 08/01/2023   10:26 AM 11/28/2022    9:25 AM 10/19/2022   11:36 AM 03/25/2021   10:34 AM 02/24/2021   10:10 AM 01/16/2020    2:14 PM  PHQ 2/9 Scores  PHQ - 2 Score 0 0 0 0 0 0 0  PHQ- 9 Score  0    0     Fall Risk    11/20/2023   10:35 AM 08/01/2023   10:26 AM 11/28/2022    9:25 AM 10/19/2022   11:36 AM 10/18/2022    1:55 PM  Fall Risk   Falls in the past year? 0 0 0 0 0  Number falls in past yr: 0 0 0 0   Injury with Fall? 0 0 0 0   Risk for fall due to : Medication side effect No Fall Risks No Fall Risks Medication side effect   Follow up Falls prevention discussed;Falls evaluation completed Falls evaluation completed Falls evaluation completed  Falls prevention discussed;Education provided;Falls evaluation completed     MEDICARE RISK AT HOME: Medicare Risk at Home Any stairs in or around the home?: (Patient-Rptd) Yes If so, are there any without handrails?: (Patient-Rptd) No Home free of loose throw rugs in walkways, pet beds, electrical cords, etc?: (Patient-Rptd) Yes Adequate lighting in your home to reduce risk of falls?: (Patient-Rptd) Yes Life alert?: (Patient-Rptd) No Use of a cane, walker or w/c?: (Patient-Rptd) No Grab bars in the bathroom?: (Patient-Rptd) No Shower chair or bench in shower?: (Patient-Rptd) No Elevated toilet seat or a handicapped toilet?: (Patient-Rptd) No  TIMED UP AND GO:  Was the test performed?  No    Cognitive Function:        11/22/2023   11:05 AM 10/19/2022   11:39 AM 03/25/2021   10:36 AM 01/16/2020    2:19 PM 01/09/2019    3:09 PM  6CIT Screen  What Year? 0 points 0 points 0 points 0 points 0 points  What month? 0 points 0 points 0 points 0 points 0 points  What time? 0 points 0 points 0 points 0 points 0 points  Count back from 20 0 points 0 points 0 points 0 points 0 points  Months in reverse 2 points 0 points 0 points 0 points 0 points  Repeat phrase 0 points 0 points 2 points 0 points 0 points  Total Score 2 points 0 points 2 points 0 points 0 points    Immunizations Immunization History  Administered Date(s) Administered   Fluad Quad(high Dose 65+) 07/03/2019, 08/18/2020, 08/24/2021, 11/28/2022   Fluad Trivalent(High Dose 65+) 08/01/2023   Influenza-Unspecified 07/20/2018   PFIZER Comirnaty(Gray Top)Covid-19 Tri-Sucrose Vaccine 04/07/2021   PFIZER(Purple Top)SARS-COV-2 Vaccination 12/21/2019, 01/15/2020, 09/19/2020   PNEUMOCOCCAL CONJUGATE-20 11/28/2022   Pfizer Covid-19 Vaccine Bivalent Booster 8yrs & up 06/28/2022   Pneumococcal Polysaccharide-23 07/05/2021   Tdap 08/05/2013  TDAP status: Due, Education has been provided regarding the importance of this vaccine.  Advised may receive this vaccine at local pharmacy or Health Dept. Aware to provide a copy of the vaccination record if obtained from local pharmacy or Health Dept. Verbalized acceptance and understanding.  Flu Vaccine status: Up to date  Pneumococcal vaccine status: Up to date  Covid-19 vaccine status: Information provided on how to obtain vaccines.   Qualifies for Shingles Vaccine? Yes   Zostavax completed No   Shingrix  Completed?: No.    Education has been provided regarding the importance of this vaccine. Patient has been advised to call insurance company to determine out of pocket expense if they have not yet received this vaccine. Advised may also receive vaccine at local pharmacy or Health Dept. Verbalized acceptance and understanding.  Screening Tests Health Maintenance  Topic Date Due   Zoster Vaccines- Shingrix  (1 of 2) Never done   COVID-19 Vaccine (6 - 2024-25 season) 07/09/2023   DTaP/Tdap/Td (2 - Td or Tdap) 08/06/2023   OPHTHALMOLOGY EXAM  09/02/2023   HEMOGLOBIN A1C  01/29/2024   Diabetic kidney evaluation - Urine ACR  03/28/2024   FOOT EXAM  03/28/2024   Diabetic kidney evaluation - eGFR measurement  07/31/2024   MAMMOGRAM  10/08/2024   Medicare Annual Wellness (AWV)  11/21/2024   Pneumonia Vaccine 64+ Years old  Completed   INFLUENZA VACCINE  Completed   DEXA SCAN  Completed   Hepatitis C Screening  Completed   HPV VACCINES  Aged Out   Colonoscopy  Discontinued    Health Maintenance  Health Maintenance Due  Topic Date Due   Zoster Vaccines- Shingrix  (1 of 2) Never done   COVID-19 Vaccine (6 - 2024-25 season) 07/09/2023   DTaP/Tdap/Td (2 - Td or Tdap) 08/06/2023   OPHTHALMOLOGY EXAM  09/02/2023    Colorectal cancer screening: No longer required.   Mammogram status: Completed 10/09/2023. Repeat every year  Bone Density status: Completed 11/02/2016.   Lung Cancer Screening: (Low Dose CT Chest recommended if Age 45-80 years, 20 pack-year currently smoking  OR have quit w/in 15years.) does not qualify.   Lung Cancer Screening Referral: no  Additional Screening:  Hepatitis C Screening: does qualify; Completed 02/24/2021  Vision Screening: Recommended annual ophthalmology exams for early detection of glaucoma and other disorders of the eye. Is the patient up to date with their annual eye exam?  Yes  Who is the provider or what is the name of the office in which the patient attends annual eye exams? [facility] If pt is not established with a provider, would they like to be referred to a provider to establish care? No .   Dental Screening: Recommended annual dental exams for proper oral hygiene  Diabetic Foot Exam: Diabetic Foot Exam: Completed 03/29/2023  Community Resource Referral / Chronic Care Management: CRR required this visit?  No   CCM required this visit?  No     Plan:     I have personally reviewed and noted the following in the patient's chart:   Medical and social history Use of alcohol, tobacco or illicit drugs  Current medications and supplements including opioid prescriptions. Patient is not currently taking opioid prescriptions. Functional ability and status Nutritional status Physical activity Advanced directives List of other physicians Hospitalizations, surgeries, and ER visits in previous 12 months Vitals Screenings to include cognitive, depression, and falls Referrals and appointments  In addition, I have reviewed and discussed with patient certain preventive protocols, quality metrics,  and best practice recommendations. A written personalized care plan for preventive services as well as general preventive health recommendations were provided to patient.     Areatha Beecham, LPN   2/44/0102   After Visit Summary: (MyChart) Due to this being a telephonic visit, the after visit summary with patients personalized plan was offered to patient via MyChart   Nurse Notes: none

## 2023-12-05 ENCOUNTER — Ambulatory Visit: Payer: Medicare Other | Admitting: Nurse Practitioner

## 2023-12-05 VITALS — BP 126/70 | HR 82 | Temp 98.0°F | Ht <= 58 in | Wt 152.2 lb

## 2023-12-05 DIAGNOSIS — E6609 Other obesity due to excess calories: Secondary | ICD-10-CM

## 2023-12-05 DIAGNOSIS — Z2821 Immunization not carried out because of patient refusal: Secondary | ICD-10-CM

## 2023-12-05 DIAGNOSIS — Z23 Encounter for immunization: Secondary | ICD-10-CM

## 2023-12-05 DIAGNOSIS — I700 Atherosclerosis of aorta: Secondary | ICD-10-CM | POA: Diagnosis not present

## 2023-12-05 DIAGNOSIS — E66811 Obesity, class 1: Secondary | ICD-10-CM

## 2023-12-05 DIAGNOSIS — Z6834 Body mass index (BMI) 34.0-34.9, adult: Secondary | ICD-10-CM

## 2023-12-05 DIAGNOSIS — I129 Hypertensive chronic kidney disease with stage 1 through stage 4 chronic kidney disease, or unspecified chronic kidney disease: Secondary | ICD-10-CM

## 2023-12-05 DIAGNOSIS — E1122 Type 2 diabetes mellitus with diabetic chronic kidney disease: Secondary | ICD-10-CM

## 2023-12-05 DIAGNOSIS — N1831 Chronic kidney disease, stage 3a: Secondary | ICD-10-CM

## 2023-12-05 MED ORDER — MEBENDAZOLE 100 MG PO CHEW: 100.0000 mg | CHEWABLE_TABLET | Freq: Every day | ORAL | 0 refills | Status: DC

## 2023-12-05 NOTE — Progress Notes (Addendum)
[name], CMA,acting as a Neurosurgeon for [name], FNP.,have documented all relevant documentation on the behalf of [name], FNP,as directed by  [name], FNP while in the presence of [name], FNP.  Subjective:  Patient ID: Anita Carpenter , female    DOB: Sep 15, 1946 , 78 y.o.   MRN: [id number]  Chief Complaint  Patient presents with   Diabetes    HPI  Patient presents today for a dm follow up, Patient reports compliance with medication. Patient denies any chest pain, SOB, or headaches. Patient reports her 36 year old grandson had parasites and was treated but the doctor recommended that she get treated as well. Patient reports this was last week. She does not have any symptoms. She has used the over the counter medications.    She does admit to eating more bad foods during the Holidays. She is physically active a little bit, she is trying to get in a routine to do something every day. She does do a little walking daily for 30 minutes. She will walk around the house due to it being cold.   Diabetes She presents for her follow-up diabetic visit. She has type 2 diabetes mellitus. Her disease course has been stable. There are no hypoglycemic associated symptoms. Pertinent negatives for diabetes include no blurred vision and no chest pain. There are no hypoglycemic complications. Diabetic complications include heart disease and nephropathy. Risk factors for coronary artery disease include tobacco exposure, diabetes mellitus, dyslipidemia, hypertension, post-menopausal and sedentary lifestyle. Current diabetic treatment includes oral agent (monotherapy). Her weight is stable. She is following a generally unhealthy diet. She has not had a previous visit with a dietitian. She participates in exercise intermittently. (Blood sugar averages 109) An ACE inhibitor/angiotensin II receptor blocker is being taken.  Hypertension This is a chronic problem. Pertinent negatives include no blurred  vision, chest pain or shortness of breath. Risk factors for coronary artery disease include sedentary lifestyle and diabetes mellitus. The current treatment provides moderate improvement. Compliance problems include exercise.      Past Medical History:  Diagnosis Date   Asthma    Diabetes mellitus without complication (HCC)    Heart murmur 1969   High cholesterol    Hypertension      Family History  Problem Relation Age of Onset   Asthma Other    Diabetes Other    Hyperlipidemia Other    Hyperlipidemia Mother    Hypertension Mother    Kidney failure Father    Stroke Father    Diabetes Daughter      Current Outpatient Medications:    Cholecalciferol (VITAMIN D-3) 125 MCG (5000 UT) TABS, Take 1 tablet by mouth 2 (two) times a week., Disp: , Rfl:    ferrous sulfate 325 (65 FE) MG tablet, Take 1 tablet (325 mg total) by mouth daily with breakfast., Disp: 30 tablet, Rfl: 3   glucose blood test strip, 1 each by Other route daily. Use as instructed, Disp: , Rfl:    mebendazole (VERMOX) 100 MG chewable tablet, Chew 1 tablet (100 mg total) by mouth daily. Take for 3 days and repeat in 2 weeks., Disp: 6 tablet, Rfl: 0   nystatin powder, Apply 1 Application topically 3 (three) times daily., Disp: 15 g, Rfl: 0   amLODipine (NORVASC) 5 MG tablet, TAKE 1 TABLET (5 MG TOTAL) BY MOUTH DAILY., Disp: 90 tablet, Rfl: 1   hydrALAZINE (APRESOLINE) 25 MG tablet, TAKE 1 TABLET (25 MG TOTAL) BY MOUTH DAILY., Disp:  90 tablet, Rfl: 1   metoprolol tartrate (LOPRESSOR) 25 MG tablet, TAKE 1 TABLET (25 MG TOTAL) BY MOUTH DAILY., Disp: 90 tablet, Rfl: 1   rosuvastatin (CRESTOR) 10 MG tablet, TAKE 1 TABLET BY MOUTH EVERY DAY, Disp: 90 tablet, Rfl: 1   Allergies  Allergen Reactions   Doxycycline Rash   Olmesartan Medoxomil-Hctz Anxiety     Review of Systems  Constitutional: Negative.   HENT: Negative.    Eyes: Negative.  Negative for blurred vision.  Respiratory: Negative.  Negative for shortness of  breath.   Cardiovascular: Negative.  Negative for chest pain.  Gastrointestinal: Negative.   Neurological: Negative.   Psychiatric/Behavioral: Negative.       Today's Vitals   12/05/23 0932 12/05/23 0950  BP: (!) 140/80 126/70  Pulse: 82   Temp: 98 F (36.7 C)   TempSrc: Oral   Weight: 152 lb 3.2 oz (69 kg)   Height: 4\' 8"  (1.422 m)   PainSc: 0-No pain    Body mass index is 34.12 kg/m.  Wt Readings from Last 3 Encounters:  12/05/23 152 lb 3.2 oz (69 kg)  08/01/23 152 lb 9.6 oz (69.2 kg)  03/29/23 151 lb 12.8 oz (68.9 kg)      Objective:  Physical Exam Vitals reviewed.  Constitutional:      General: She is not in acute distress.    Appearance: Normal appearance. She is well-developed. She is obese.  Cardiovascular:     Rate and Rhythm: Normal rate and regular rhythm.     Pulses: Normal pulses.     Heart sounds: Normal heart sounds. No murmur heard. Pulmonary:     Effort: Pulmonary effort is normal. No respiratory distress.     Breath sounds: Normal breath sounds. No wheezing.  Chest:     Chest wall: No tenderness.  Skin:    General: Skin is warm and dry.     Capillary Refill: Capillary refill takes less than 2 seconds.     Coloration: Skin is not jaundiced.     Findings: No bruising.  Neurological:     General: No focal deficit present.     Mental Status: She is alert and oriented to person, place, and time.     Cranial Nerves: No cranial nerve deficit.     Motor: No weakness.  Psychiatric:        Mood and Affect: Mood normal.        Behavior: Behavior normal.        Thought Content: Thought content normal.        Judgment: Judgment normal.         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 Continue current medications   Aortic atherosclerosis (HCC) Assessment & Plan: Continue statin, tolerating well   Herpes zoster vaccination declined Assessment & Plan: Declines shingrix, educated on disease process and is aware if he changes his mind to notify office    COVID-19 vaccine administered Assessment & Plan: Covid 19 vaccine given in office observed for 15 minutes without any adverse reaction   Orders: -  Pfizer Comirnaty Covid-19 Vaccine 89yrs & older  Class 1 obesity due to excess calories with body mass index (BMI) of 34.0 to 34.9 in adult, unspecified whether serious comorbidity present Assessment & Plan: She is encouraged to strive for BMI less than 30 to decrease cardiac risk. Advised to aim for at least 150 minutes of exercise per week.    Other orders -     Mebendazole; Chew 1 tablet (100 mg total) by mouth daily. Take for 3 days and repeat in 2 weeks.  Dispense: 6 tablet; Refill: 0    Return for controlled DM check 4 months.  Patient was given opportunity to ask questions. Patient verbalized understanding of the plan and was able to repeat key elements of the plan. All questions were answered to their satisfaction.    Jeanell Sparrow, FNP, have reviewed all documentation for this visit. The documentation on 12/05/23 for the exam, diagnosis, procedures, and orders are all accurate and complete.   IF YOU HAVE BEEN REFERRED TO A SPECIALIST, IT MAY TAKE 1-2 WEEKS TO SCHEDULE/PROCESS THE REFERRAL. IF YOU HAVE NOT HEARD FROM US/SPECIALIST IN TWO WEEKS, PLEASE GIVE [place] A CALL AT [phone].

## 2023-12-06 LAB — BMP8+EGFR
BUN/Creatinine Ratio: 16 (ref 12–28)
BUN: 14 mg/dL (ref 8–27)
CO2: 23 mmol/L (ref 20–29)
Calcium: 10.1 mg/dL (ref 8.7–10.3)
Chloride: 103 mmol/L (ref 96–106)
Creatinine, Ser: 0.85 mg/dL (ref 0.57–1.00)
Glucose: 111 mg/dL — ABNORMAL HIGH (ref 70–99)
Potassium: 4.2 mmol/L (ref 3.5–5.2)
Sodium: 141 mmol/L (ref 134–144)
eGFR: 71 mL/min/{1.73_m2} (ref >59)

## 2023-12-06 LAB — HEMOGLOBIN A1C
Est. average glucose Bld gHb Est-mCnc: 134 mg/dL
Hgb A1c MFr Bld: 6.3 % — ABNORMAL HIGH (ref 4.8–5.6)

## 2023-12-06 LAB — LIPID PANEL
Chol/HDL Ratio: 2.2 {ratio} (ref 0.0–4.4)
Cholesterol, Total: 153 mg/dL (ref 100–199)
HDL: 70 mg/dL (ref >39)
LDL Chol Calc (NIH): 68 mg/dL (ref 0–99)
Triglycerides: 81 mg/dL (ref 0–149)
VLDL Cholesterol Cal: 15 mg/dL (ref 5–40)

## 2023-12-10 ENCOUNTER — Encounter: Payer: Self-pay | Admitting: Nurse Practitioner

## 2023-12-10 DIAGNOSIS — Z2821 Immunization not carried out because of patient refusal: Secondary | ICD-10-CM | POA: Insufficient documentation

## 2023-12-10 DIAGNOSIS — Z23 Encounter for immunization: Secondary | ICD-10-CM | POA: Insufficient documentation

## 2023-12-10 NOTE — Assessment & Plan Note (Signed)
 She is encouraged to strive for BMI less than 30 to decrease cardiac risk. Advised to aim for at least 150 minutes of exercise per week.

## 2023-12-10 NOTE — Assessment & Plan Note (Signed)
 Continue statin, tolerating well

## 2023-12-10 NOTE — Assessment & Plan Note (Signed)
 Covid 19 vaccine given in office observed for 15 minutes without any adverse reaction

## 2023-12-10 NOTE — Assessment & Plan Note (Signed)
HgbA1c has been stable at 6.5 pending results will consider medication treatment pending labs

## 2023-12-10 NOTE — Assessment & Plan Note (Addendum)
Blood pressure increased but improved with repeat. Continue current medications

## 2023-12-10 NOTE — Assessment & Plan Note (Signed)
 Declines shingrix, educated on disease process and is aware if he changes his mind to notify office

## 2023-12-15 ENCOUNTER — Other Ambulatory Visit: Payer: Self-pay | Admitting: Nurse Practitioner

## 2023-12-15 DIAGNOSIS — I129 Hypertensive chronic kidney disease with stage 1 through stage 4 chronic kidney disease, or unspecified chronic kidney disease: Secondary | ICD-10-CM

## 2023-12-15 DIAGNOSIS — I700 Atherosclerosis of aorta: Secondary | ICD-10-CM

## 2023-12-19 NOTE — Code Documentation (Signed)
done

## 2024-04-03 ENCOUNTER — Encounter: Payer: Self-pay | Admitting: Nurse Practitioner

## 2024-04-03 ENCOUNTER — Ambulatory Visit (INDEPENDENT_AMBULATORY_CARE_PROVIDER_SITE_OTHER): Payer: Medicare Other | Admitting: Nurse Practitioner

## 2024-04-03 ENCOUNTER — Ambulatory Visit: Payer: Medicare Other | Admitting: Nurse Practitioner

## 2024-04-03 VITALS — BP 130/70 | HR 75 | Temp 99.2°F | Ht <= 58 in | Wt 153.2 lb

## 2024-04-03 DIAGNOSIS — Z6834 Body mass index (BMI) 34.0-34.9, adult: Secondary | ICD-10-CM

## 2024-04-03 DIAGNOSIS — I129 Hypertensive chronic kidney disease with stage 1 through stage 4 chronic kidney disease, or unspecified chronic kidney disease: Secondary | ICD-10-CM

## 2024-04-03 DIAGNOSIS — Z0000 Encounter for general adult medical examination without abnormal findings: Secondary | ICD-10-CM | POA: Diagnosis not present

## 2024-04-03 DIAGNOSIS — N182 Chronic kidney disease, stage 2 (mild): Secondary | ICD-10-CM | POA: Diagnosis not present

## 2024-04-03 DIAGNOSIS — Z2821 Immunization not carried out because of patient refusal: Secondary | ICD-10-CM

## 2024-04-03 DIAGNOSIS — E559 Vitamin D deficiency, unspecified: Secondary | ICD-10-CM

## 2024-04-03 DIAGNOSIS — N1831 Chronic kidney disease, stage 3a: Secondary | ICD-10-CM

## 2024-04-03 DIAGNOSIS — E6609 Other obesity due to excess calories: Secondary | ICD-10-CM

## 2024-04-03 DIAGNOSIS — E66811 Obesity, class 1: Secondary | ICD-10-CM

## 2024-04-03 DIAGNOSIS — E1122 Type 2 diabetes mellitus with diabetic chronic kidney disease: Secondary | ICD-10-CM | POA: Diagnosis not present

## 2024-04-03 LAB — POCT URINALYSIS DIP (CLINITEK)
Bilirubin, UA: NEGATIVE
Blood, UA: NEGATIVE
Glucose, UA: NEGATIVE mg/dL
Ketones, POC UA: NEGATIVE mg/dL
Leukocytes, UA: NEGATIVE
Nitrite, UA: NEGATIVE
POC PROTEIN,UA: NEGATIVE
Spec Grav, UA: 1.02 (ref 1.010–1.025)
Urobilinogen, UA: 0.2 U/dL
pH, UA: 5.5 (ref 5.0–8.0)

## 2024-04-03 NOTE — Patient Instructions (Signed)
 Health Maintenance  Topic Date Due   Yearly kidney health urinalysis for diabetes  03/28/2024   Complete foot exam   03/28/2024   Zoster (Shingles) Vaccine (1 of 2) 07/04/2024*   DTaP/Tdap/Td vaccine (2 - Td or Tdap) 04/03/2025*   Hemoglobin A1C  06/03/2024   COVID-19 Vaccine (7 - Pfizer risk 2024-25 season) 06/03/2024   Flu Shot  06/07/2024   Eye exam for diabetics  09/06/2024   Mammogram  10/08/2024   Medicare Annual Wellness Visit  11/21/2024   Yearly kidney function blood test for diabetes  12/04/2024   Pneumonia Vaccine  Completed   DEXA scan (bone density measurement)  Completed   Hepatitis C Screening  Completed   HPV Vaccine  Aged Out   Meningitis B Vaccine  Aged Out   Colon Cancer Screening  Discontinued  *Topic was postponed. The date shown is not the original due date.

## 2024-04-03 NOTE — Assessment & Plan Note (Signed)
 Annual physical done today with examination and diabetic foot exam.  All health maintenance items addressed

## 2024-04-03 NOTE — Assessment & Plan Note (Deleted)
 Urine microalbumin and CMP14-eGFR done today

## 2024-04-03 NOTE — Assessment & Plan Note (Addendum)
 Chronic, blood pressure is controlled, continue current medications. EKG performed, NSR with ST depression nonspecific, HR 88

## 2024-04-03 NOTE — Assessment & Plan Note (Signed)
 Stable, continue medications as prescribed, continue dietary management by following a low sugar, low salt diet with variety of fruits and vegetables

## 2024-04-03 NOTE — Progress Notes (Signed)
 Anita Carpenter, CMA,acting as a Neurosurgeon for Anita Epley, FNP.,have documented all relevant documentation on the behalf of Anita Epley, FNP,as directed by  Anita Epley, FNP while in the presence of Anita Epley, FNP.  Subjective:    Patient ID: Anita Carpenter , female    DOB: 1946/07/08 , 78 y.o.   MRN: [id number]  No chief complaint on file.   HPI  No recent MD visits.  No complaints, no headaches, sinus issues, no chest pain, no SOB.  No concerns with BP being elevated or low, check BP occasionally at home, readings are within normal range around 130/80.  No nausea, abdominal pain, constipation or diarrhea.    Endorses urinary urgency, has experienced this more with increasing her water intake, wears depends when she will be away from the restroom for a while.  Does not experience stress incontinence.  Has taken medication for this in the past but was not effective, feels that this is under control. Does pelvic floor exercises.   Has occasional hand and feet swelling, relived with elevation, loose clothing.  She has this more when she is not active for a few days.    Diet has improved, has been eating more of a variety of foods, but has ate more sweets during graduation season.  Tries to fix meals a home, over all healthy eating with low carb, low salt foods.  Increased fruits, vegetables and nuts since last visit with less fast food. Drinks water more than before, she is up to 4 bottle most days of the week.  Has recently stopped drinking sodas and sugary drinks.  Does not add sugar to coffee.    Exercises occasionally, uses walking and a weighted hula hoop 1-2 times every other week.  Daughter is helping her get membership for [facility] for more routine exercise.  She is interested in the dance and yoga classes.       Past Medical History:  Diagnosis Date   Asthma    Chest pain 01/23/2014   Chronic renal disease, stage II 09/17/2018   Diabetes mellitus without complication (HCC)     Dyspnea 05/07/2019   Heart murmur 1969   High cholesterol    History of tobacco use 05/07/2019   Hypertension      Family History  Problem Relation Age of Onset   Asthma Other    Diabetes Other    Hyperlipidemia Other    Hyperlipidemia Mother    Hypertension Mother    Kidney failure Father    Stroke Father    Diabetes Daughter      Current Outpatient Medications:    amLODipine  (NORVASC ) 5 MG tablet, TAKE 1 TABLET (5 MG TOTAL) BY MOUTH DAILY., Disp: 90 tablet, Rfl: 1   Cholecalciferol (VITAMIN D -3) 125 MCG (5000 UT) TABS, Take 1 tablet by mouth 2 (two) times a week., Disp: , Rfl:    ferrous sulfate  325 (65 FE) MG tablet, Take 1 tablet (325 mg total) by mouth daily with breakfast., Disp: 30 tablet, Rfl: 3   glucose blood test strip, 1 each by Other route daily. Use as instructed, Disp: , Rfl:    hydrALAZINE  (APRESOLINE ) 25 MG tablet, TAKE 1 TABLET (25 MG TOTAL) BY MOUTH DAILY., Disp: 90 tablet, Rfl: 1   metoprolol  tartrate (LOPRESSOR ) 25 MG tablet, TAKE 1 TABLET (25 MG TOTAL) BY MOUTH DAILY., Disp: 90 tablet, Rfl: 1   nystatin  powder, Apply 1 Application topically 3 (three) times daily., Disp: 15 g, Rfl: 0   rosuvastatin  (  CRESTOR ) 10 MG tablet, TAKE 1 TABLET BY MOUTH EVERY DAY, Disp: 90 tablet, Rfl: 1   Allergies  Allergen Reactions   Doxycycline Rash   Olmesartan Medoxomil-Hctz Anxiety     . The patient's tobacco use is:  Social History   Tobacco Use  Smoking Status Former   Current packs/day: 0.00   Average packs/day: 0.3 packs/day for 58.5 years (15.0 ttl pk-yrs)   Types: Cigarettes   Start date: 36   Quit date: 12/03/2018   Years since quitting: 5.3  Smokeless Tobacco Never  Tobacco Comments   1ppdx  . She has been exposed to passive smoke. The patient's alcohol use is:  Social History   Substance and Sexual Activity  Alcohol Use Not Currently    Review of Systems  Constitutional:  Negative for appetite change, chills, fatigue and fever.  HENT:  Negative  for congestion and dental problem.   Eyes:  Negative for visual disturbance.  Respiratory:  Negative for cough, chest tightness and shortness of breath.   Cardiovascular:  Negative for chest pain, palpitations and leg swelling.  Gastrointestinal:  Negative for constipation, diarrhea and nausea.  Endocrine: Negative for polydipsia, polyphagia and polyuria.  Genitourinary:  Positive for urgency. Negative for pelvic pain.  Musculoskeletal:  Negative for arthralgias.  Skin:  Negative for rash and wound.  Neurological:  Negative for dizziness, syncope, light-headedness and headaches.     Today's Vitals   04/03/24 1113  BP: 130/70  Pulse: 75  Temp: 99.2 F (37.3 C)  TempSrc: Oral  Weight: 153 lb 3.2 oz (69.5 kg)  Height: 4\' 8"  (1.422 m)  PainSc: 0-No pain   Body mass index is 34.35 kg/m.  Wt Readings from Last 3 Encounters:  04/03/24 153 lb 3.2 oz (69.5 kg)  12/05/23 152 lb 3.2 oz (69 kg)  08/01/23 152 lb 9.6 oz (69.2 kg)     Objective:  Physical Exam Constitutional:      General: She is not in acute distress.    Appearance: She is obese.  HENT:     Right Ear: External ear normal.     Left Ear: External ear normal.     Nose: Nose normal.     Mouth/Throat:     Mouth: Mucous membranes are moist.     Pharynx: Oropharynx is clear.  Eyes:     Pupils: Pupils are equal, round, and reactive to light.  Neck:     Vascular: No carotid bruit.     Trachea: Trachea normal. No tracheal tenderness or tracheal deviation.  Cardiovascular:     Rate and Rhythm: Normal rate and regular rhythm.     Pulses: Normal pulses.     Heart sounds: Normal heart sounds. No murmur heard. Pulmonary:     Effort: Pulmonary effort is normal.     Breath sounds: Normal breath sounds. No wheezing.  Chest:     Chest wall: No tenderness.  Abdominal:     General: Bowel sounds are normal. There is no distension.     Palpations: There is no mass.     Tenderness: There is no abdominal tenderness.     Hernia:  No hernia is present.  Musculoskeletal:        General: No swelling or tenderness. Normal range of motion.     Right lower leg: No edema.     Left lower leg: No edema.  Lymphadenopathy:     Cervical: No cervical adenopathy (left submandibular gland enlarged).  Skin:    General: Skin is  warm and dry.     Capillary Refill: Capillary refill takes less than 2 seconds.  Neurological:     General: No focal deficit present.     Mental Status: She is alert and oriented to person, place, and time.     Motor: No weakness.     Gait: Gait normal.  Psychiatric:        Mood and Affect: Mood normal.        Behavior: Behavior normal.        Thought Content: Thought content normal.        Judgment: Judgment normal.    .   Title   Diabetic Foot Exam - detailed Is there a history of foot ulcer?: No Is there a foot ulcer now?: No Is there swelling?: No Is there elevated skin temperature?: No Is there abnormal foot shape?: No Is there a claw toe deformity?: No Are the toenails long?: No Are the toenails thick?: No Are the toenails ingrown?: No Is the skin thin, fragile, shiny and hairless?": No Normal Range of Motion?: No Is there foot or ankle muscle weakness?: No Do you have pain in calf while walking?: No Are the shoes appropriate in style and fit?: Yes Can the patient see the bottom of their feet?: Yes Pulse Foot Exam completed.: Yes   Right Posterior Tibialis: Present Left posterior Tibialis: Present   Right Dorsalis Pedis: Present Left Dorsalis Pedis: Present     Sensory Foot Exam Completed.: Yes Semmes-Weinstein Monofilament Test "+" means "has sensation" and "-" means "no sensation"   R Site 1-Great Toe: Pos L Site 1-Great Toe: Pos   R site 5: Pos L Site 5: Pos  R Site 6: Pos L Site 6: Pos     Image components are not supported.   Image components are not supported. Image components are not supported.  [place] Comments          Assessment And Plan:      Encounter for annual health examination Assessment & Plan: Annual physical done today with examination and diabetic foot exam.  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 EKG performed, NSR with ST depression nonspecific, HR 88  Orders: -     EKG 12-Lead -     POCT URINALYSIS DIP (CLINITEK) -     Microalbumin / creatinine urine ratio -     CBC with Differential/Platelet -     CMP14+EGFR  Type 2 diabetes mellitus with stage 2 chronic kidney disease, without long-term current use of insulin (HCC) Assessment & Plan: Chronic, stable. Continue current medications. Diabetic foot exam done.  Urine microalbumin and CMP14-eGFR done today  Orders: -     EKG 12-Lead -     POCT URINALYSIS DIP (CLINITEK) -     Microalbumin / creatinine urine ratio -     Hemoglobin A1c  Vitamin D  deficiency Assessment & Plan: Will check vitamin D  level and supplement as needed.    Also encouraged to spend 15 minutes in the sun daily.     Herpes zoster vaccination declined Assessment & Plan: Declines shingrix , educated on disease process and is aware if he changes his mind to notify office    Tetanus, diphtheria, and acellular pertussis (Tdap) vaccination declined  Class 1  obesity due to excess calories with body mass index (BMI) of 34.0 to 34.9 in adult, unspecified whether serious comorbidity present Assessment & Plan: She is encouraged to strive for BMI less than 30 to decrease cardiac risk. Advised to aim for at least 150 minutes of exercise per week.       No follow-ups on file. Patient was given opportunity to ask questions. Patient verbalized understanding of the plan and was able to repeat key elements of the plan. All questions were answered to their  satisfaction.   Anita Epley, FNP  I have reviewed this encounter including the documentation in this note and/or discussed this patient with Mickael Alamo, FNP - Student.  I am certifying that I agree with the content of this note as the primary care nurse practitioner.  Anita Epley, DNP, FNP-BC   I, Anita Epley, FNP, have reviewed all documentation for this visit. The documentation on 04/03/24 for the exam, diagnosis, procedures, and orders are all accurate and complete.

## 2024-04-04 LAB — CBC WITH DIFFERENTIAL/PLATELET
Basophils Absolute: 0.1 10*3/uL (ref 0.0–0.2)
Basos: 1 %
EOS (ABSOLUTE): 0 10*3/uL (ref 0.0–0.4)
Eos: 1 %
Hematocrit: 41.5 % (ref 34.0–46.6)
Hemoglobin: 12 g/dL (ref 11.1–15.9)
Immature Grans (Abs): 0 10*3/uL (ref 0.0–0.1)
Immature Granulocytes: 1 %
Lymphocytes Absolute: 1.1 10*3/uL (ref 0.7–3.1)
Lymphs: 22 %
MCH: 22.6 pg — ABNORMAL LOW (ref 26.6–33.0)
MCHC: 28.9 g/dL — ABNORMAL LOW (ref 31.5–35.7)
MCV: 78 fL — ABNORMAL LOW (ref 79–97)
Monocytes Absolute: 0.4 10*3/uL (ref 0.1–0.9)
Monocytes: 7 %
Neutrophils Absolute: 3.6 10*3/uL (ref 1.4–7.0)
Neutrophils: 68 %
Platelets: 349 10*3/uL (ref 150–450)
RBC: 5.31 x10E6/uL — ABNORMAL HIGH (ref 3.77–5.28)
RDW: 14.3 % (ref 11.7–15.4)
WBC: 5.2 10*3/uL (ref 3.4–10.8)

## 2024-04-04 LAB — LIPID PANEL
Chol/HDL Ratio: 2 ratio (ref 0.0–4.4)
Cholesterol, Total: 147 mg/dL (ref 100–199)
HDL: 75 mg/dL (ref >39)
LDL Chol Calc (NIH): 57 mg/dL (ref 0–99)
Triglycerides: 77 mg/dL (ref 0–149)
VLDL Cholesterol Cal: 15 mg/dL (ref 5–40)

## 2024-04-04 LAB — CMP14+EGFR
ALT: 15 IU/L (ref 0–32)
AST: 21 IU/L (ref 0–40)
Albumin: 4.8 g/dL (ref 3.8–4.8)
Alkaline Phosphatase: 85 IU/L (ref 44–121)
BUN/Creatinine Ratio: 16 (ref 12–28)
BUN: 15 mg/dL (ref 8–27)
Bilirubin Total: 0.7 mg/dL (ref 0.0–1.2)
CO2: 23 mmol/L (ref 20–29)
Calcium: 10.1 mg/dL (ref 8.7–10.3)
Chloride: 100 mmol/L (ref 96–106)
Creatinine, Ser: 0.91 mg/dL (ref 0.57–1.00)
Globulin, Total: 2.8 g/dL (ref 1.5–4.5)
Glucose: 104 mg/dL — ABNORMAL HIGH (ref 70–99)
Potassium: 4.9 mmol/L (ref 3.5–5.2)
Sodium: 141 mmol/L (ref 134–144)
Total Protein: 7.6 g/dL (ref 6.0–8.5)
eGFR: 65 mL/min/{1.73_m2} (ref >59)

## 2024-04-04 LAB — MICROALBUMIN / CREATININE URINE RATIO
Creatinine, Urine: 82.2 mg/dL
Microalb/Creat Ratio: 21 mg/g{creat} (ref 0–29)
Microalbumin, Urine: 17 ug/mL

## 2024-04-04 LAB — HEMOGLOBIN A1C
Est. average glucose Bld gHb Est-mCnc: 131 mg/dL
Hgb A1c MFr Bld: 6.2 % — ABNORMAL HIGH (ref 4.8–5.6)

## 2024-04-06 ENCOUNTER — Ambulatory Visit: Payer: Self-pay | Admitting: Nurse Practitioner

## 2024-04-06 DIAGNOSIS — E1122 Type 2 diabetes mellitus with diabetic chronic kidney disease: Secondary | ICD-10-CM | POA: Insufficient documentation

## 2024-04-06 NOTE — Assessment & Plan Note (Addendum)
 Chronic, stable. Continue current medications. Diabetic foot exam done.  Urine microalbumin and CMP14-eGFR done today

## 2024-04-06 NOTE — Assessment & Plan Note (Signed)
 Will check vitamin D level and supplement as needed.    Also encouraged to spend 15 minutes in the sun daily.

## 2024-04-06 NOTE — Assessment & Plan Note (Signed)
 Declines shingrix, educated on disease process and is aware if he changes his mind to notify office

## 2024-04-06 NOTE — Assessment & Plan Note (Signed)
 She is encouraged to strive for BMI less than 30 to decrease cardiac risk. Advised to aim for at least 150 minutes of exercise per week.

## 2024-08-05 ENCOUNTER — Encounter: Payer: Self-pay | Admitting: Nurse Practitioner

## 2024-08-05 ENCOUNTER — Ambulatory Visit (INDEPENDENT_AMBULATORY_CARE_PROVIDER_SITE_OTHER): Payer: Self-pay | Admitting: Nurse Practitioner

## 2024-08-05 VITALS — BP 124/78 | HR 73 | Temp 98.3°F | Ht <= 58 in | Wt 153.0 lb

## 2024-08-05 DIAGNOSIS — I129 Hypertensive chronic kidney disease with stage 1 through stage 4 chronic kidney disease, or unspecified chronic kidney disease: Secondary | ICD-10-CM | POA: Diagnosis not present

## 2024-08-05 DIAGNOSIS — Z79899 Other long term (current) drug therapy: Secondary | ICD-10-CM

## 2024-08-05 DIAGNOSIS — E1122 Type 2 diabetes mellitus with diabetic chronic kidney disease: Secondary | ICD-10-CM | POA: Diagnosis not present

## 2024-08-05 DIAGNOSIS — I700 Atherosclerosis of aorta: Secondary | ICD-10-CM | POA: Diagnosis not present

## 2024-08-05 DIAGNOSIS — E6609 Other obesity due to excess calories: Secondary | ICD-10-CM

## 2024-08-05 DIAGNOSIS — Z6834 Body mass index (BMI) 34.0-34.9, adult: Secondary | ICD-10-CM

## 2024-08-05 DIAGNOSIS — E66811 Obesity, class 1: Secondary | ICD-10-CM

## 2024-08-05 DIAGNOSIS — N182 Chronic kidney disease, stage 2 (mild): Secondary | ICD-10-CM

## 2024-08-05 DIAGNOSIS — E559 Vitamin D deficiency, unspecified: Secondary | ICD-10-CM

## 2024-08-05 MED ORDER — METOPROLOL TARTRATE 25 MG PO TABS: 25.0000 mg | ORAL_TABLET | Freq: Every day | ORAL | Status: DC

## 2024-08-05 MED ORDER — ROSUVASTATIN CALCIUM 10 MG PO TABS: 10.0000 mg | ORAL_TABLET | Freq: Every day | ORAL | Status: DC

## 2024-08-05 MED ORDER — HYDRALAZINE HCL 25 MG PO TABS: 25.0000 mg | ORAL_TABLET | Freq: Every day | ORAL | Status: DC

## 2024-08-05 NOTE — Progress Notes (Signed)
 I,[name], CMA,acting as a Neurosurgeon for [name], FNP.,have documented all relevant documentation on the behalf of [name], FNP,as directed by  [name], FNP while in the presence of [name], FNP.  Subjective:  Patient ID: Anita Carpenter , female    DOB: August 09, 1946 , 78 y.o.   MRN: [id number]  Chief Complaint  Patient presents with   Hypertension    Patient presents today for a dm & bp follow up, Patient reports compliance with medication. Patient denies any chest pain, SOB, or headaches.   Diabetes    Discussed the use of AI scribe software for clinical note transcription with the patient, who gave verbal consent to proceed.  History of Present Illness Anita Carpenter is a 78 year old female who presents for a routine follow-up visit.  There was a previous issue with her hydralazine  prescription being filled twice due to an auto-refill system. She has not missed taking her medication.  She recently received the COVID vaccine on July 29, 2024, and is considering getting the flu vaccine today. She is trying to resolve an issue with her insurance regarding coverage for the shingles vaccine, which has been problematic for the past two years.  She is not experiencing any cold symptoms and confirms she is feeling well overall. She recently turned 62 in August and is actively helping a friend move after a building collapse.  She inquired about her tetanus vaccination status.      Hypertension This is a chronic problem. Pertinent negatives include no blurred vision, chest pain or shortness of breath. Risk factors for coronary artery disease include sedentary lifestyle and diabetes mellitus. The current treatment provides moderate improvement. Compliance problems include exercise.   Diabetes She presents for her follow-up diabetic visit. She has type 2 diabetes mellitus. Her disease course has been stable. There are no hypoglycemic associated symptoms. Pertinent  negatives for diabetes include no blurred vision and no chest pain. There are no hypoglycemic complications. Diabetic complications include heart disease and nephropathy. Risk factors for coronary artery disease include tobacco exposure, diabetes mellitus, dyslipidemia, hypertension, post-menopausal and sedentary lifestyle. Current diabetic treatment includes oral agent (monotherapy). Her weight is stable. She is following a generally unhealthy diet. She has not had a previous visit with a dietitian. She participates in exercise intermittently. (Blood sugar averages 109) An ACE inhibitor/angiotensin II receptor blocker is being taken.     Past Medical History:  Diagnosis Date   Asthma    Chest pain 01/23/2014   Chronic renal disease, stage II 09/17/2018   Diabetes mellitus without complication (HCC)    Dyspnea 05/07/2019   Heart murmur 1969   High cholesterol    History of tobacco use 05/07/2019   Hypertension      Family History  Problem Relation Age of Onset   Asthma Other    Diabetes Other    Hyperlipidemia Other    Hyperlipidemia Mother    Hypertension Mother    Kidney failure Father    Stroke Father    Diabetes Daughter      Current Outpatient Medications:    amLODipine  (NORVASC ) 5 MG tablet, TAKE 1 TABLET (5 MG TOTAL) BY MOUTH DAILY., Disp: 90 tablet, Rfl: 1   Cholecalciferol (VITAMIN D -3) 125 MCG (5000 UT) TABS, Take 1 tablet by mouth 2 (two) times a week., Disp: , Rfl:    ferrous sulfate  325 (65 FE) MG tablet, Take 1 tablet (325 mg total) by mouth daily with breakfast., Disp: 30 tablet,  Rfl: 3   glucose blood test strip, 1 each by Other route daily. Use as instructed, Disp: , Rfl:    nystatin  powder, Apply 1 Application topically 3 (three) times daily., Disp: 15 g, Rfl: 0   hydrALAZINE  (APRESOLINE ) 25 MG tablet, Take 1 tablet (25 mg total) by mouth daily., Disp: , Rfl:    metoprolol  tartrate (LOPRESSOR ) 25 MG tablet, Take 1 tablet (25 mg total) by mouth daily., Disp: ,  Rfl:    rosuvastatin  (CRESTOR ) 10 MG tablet, Take 1 tablet (10 mg total) by mouth daily., Disp: , Rfl:    Allergies  Allergen Reactions   Doxycycline Rash   Olmesartan Medoxomil-Hctz Anxiety     Review of Systems  Constitutional: Negative.   Eyes:  Negative for blurred vision.  Respiratory: Negative.  Negative for shortness of breath.   Cardiovascular: Negative.  Negative for chest pain.  Neurological: Negative.   Psychiatric/Behavioral: Negative.       Today's Vitals   08/05/24 1218  BP: 124/78  Pulse: 73  Temp: 98.3 F (36.8 C)  SpO2: 98%  Weight: 153 lb (69.4 kg)  Height: 4' 8 (1.422 m)   Body mass index is 34.3 kg/m.  Wt Readings from Last 3 Encounters:  08/05/24 153 lb (69.4 kg)  04/03/24 153 lb 3.2 oz (69.5 kg)  12/05/23 152 lb 3.2 oz (69 kg)     Objective:  Physical Exam Vitals and nursing note reviewed.  Constitutional:      General: She is not in acute distress.    Appearance: Normal appearance. She is well-developed. She is obese.  Cardiovascular:     Rate and Rhythm: Normal rate and regular rhythm.     Pulses: Normal pulses.     Heart sounds: Normal heart sounds. No murmur heard. Pulmonary:     Effort: Pulmonary effort is normal. No respiratory distress.     Breath sounds: Normal breath sounds. No wheezing.  Chest:     Chest wall: No tenderness.  Skin:    General: Skin is warm and dry.     Capillary Refill: Capillary refill takes less than 2 seconds.     Coloration: Skin is not jaundiced.     Findings: No bruising.  Neurological:     General: No focal deficit present.     Mental Status: She is alert and oriented to person, place, and time.     Cranial Nerves: No cranial nerve deficit.     Motor: No weakness.  Psychiatric:        Mood and Affect: Mood normal.        Behavior: Behavior normal.        Thought Content: Thought content normal.        Judgment: Judgment normal.        Assessment And Plan:  Malignant hypertension with chronic  renal disease stage II Assessment & Plan: Blood pressure increased but improved with repeat. Continue current medications  Orders: -     CMP14+EGFR  Type 2 diabetes mellitus with stage 2 chronic kidney disease, without long-term current use of insulin (HCC) Assessment & Plan: Chronic, stable. Continue current medications.   Orders: -     Lipid panel -     Hemoglobin A1c  Vitamin D  deficiency Assessment & Plan: Will check vitamin D  level and supplement as needed.    Also encouraged to spend 15 minutes in the sun daily.     Class 1 obesity due to excess calories with body mass index (BMI) of  34.0 to 34.9 in adult, unspecified whether serious comorbidity present Assessment & Plan: She is encouraged to strive for BMI less than 30 to decrease cardiac risk. Advised to aim for at least 150 minutes of exercise per week.     Other long term (current) drug therapy -     CBC  Hypertensive nephropathy Assessment & Plan: Blood pressure is well controlled, continue current medications.   Orders: -     hydrALAZINE  HCl; Take 1 tablet (25 mg total) by mouth daily. -     Metoprolol  Tartrate; Take 1 tablet (25 mg total) by mouth daily.  Aortic atherosclerosis Assessment & Plan: Continue statin, tolerating well.  Orders: -     Rosuvastatin  Calcium ; Take 1 tablet (10 mg total) by mouth daily.   Up to date with RSV and COVID vaccines. Due for tetanus booster and shingles vaccine. Insurance coverage issue for shingles vaccine. - Administered flu vaccine today. - Schedule tetanus booster for nurse visit in two weeks. - Check insurance coverage for shingles vaccine using [employer] at next visit. - Advised against paying $300 for shingles vaccine; check coverage at [facility]. - Informed she can receive flu and COVID vaccines together.   Return for  4 months f/u hypertension.  Patient was given opportunity to ask questions. Patient verbalized understanding of the plan and was able to  repeat key elements of the plan. All questions were answered to their satisfaction.  [name], FNP  I, [name], FNP, have reviewed all documentation for this visit. The documentation on 08/05/24 for the exam, diagnosis, procedures, and orders are all accurate and complete.   IF YOU HAVE BEEN REFERRED TO A SPECIALIST, IT MAY TAKE 1-2 WEEKS TO SCHEDULE/PROCESS THE REFERRAL. IF YOU HAVE NOT HEARD FROM US /SPECIALIST IN TWO WEEKS, PLEASE GIVE US  A CALL AT [phone].   THE PATIENT IS ENCOURAGED TO PRACTICE SOCIAL DISTANCING DUE TO THE COVID-19 PANDEMIC.

## 2024-08-05 NOTE — Patient Instructions (Signed)
 Hypertension, Adult Hypertension is another name for high blood pressure. High blood pressure forces your heart to work harder to pump blood. This can cause problems over time. There are two numbers in a blood pressure reading. There is a top number (systolic) over a bottom number (diastolic). It is best to have a blood pressure that is below 120/80. What are the causes? The cause of this condition is not known. Some other conditions can lead to high blood pressure. What increases the risk? Some lifestyle factors can make you more likely to develop high blood pressure: Smoking. Not getting enough exercise or physical activity. Being overweight. Having too much fat, sugar, calories, or salt (sodium) in your diet. Drinking too much alcohol. Other risk factors include: Having any of these conditions: Heart disease. Diabetes. High cholesterol. Kidney disease. Obstructive sleep apnea. Having a family history of high blood pressure and high cholesterol. Age. The risk increases with age. Stress. What are the signs or symptoms? High blood pressure may not cause symptoms. Very high blood pressure (hypertensive crisis) may cause: Headache. Fast or uneven heartbeats (palpitations). Shortness of breath. Nosebleed. Vomiting or feeling like you may vomit (nauseous). Changes in how you see. Very bad chest pain. Feeling dizzy. Seizures. How is this treated? This condition is treated by making healthy lifestyle changes, such as: Eating healthy foods. Exercising more. Drinking less alcohol. Your doctor may prescribe medicine if lifestyle changes do not help enough and if: Your top number is above 130. Your bottom number is above 80. Your personal target blood pressure may vary. Follow these instructions at home: Eating and drinking  If told, follow the DASH eating plan. To follow this plan: Fill one half of your plate at each meal with fruits and vegetables. Fill one fourth of your plate  at each meal with whole grains. Whole grains include whole-wheat pasta, brown rice, and whole-grain bread. Eat or drink low-fat dairy products, such as skim milk or low-fat yogurt. Fill one fourth of your plate at each meal with low-fat (lean) proteins. Low-fat proteins include fish, chicken without skin, eggs, beans, and tofu. Avoid fatty meat, cured and processed meat, or chicken with skin. Avoid pre-made or processed food. Limit the amount of salt in your diet to less than 1,500 mg each day. Do not drink alcohol if: Your doctor tells you not to drink. You are pregnant, may be pregnant, or are planning to become pregnant. If you drink alcohol: Limit how much you have to: 0-1 drink a day for women. 0-2 drinks a day for men. Know how much alcohol is in your drink. In the [place] one drink equals one 12 oz bottle of beer (355 mL), one 5 oz glass of wine (148 mL), or one 1 oz glass of hard liquor (44 mL). Lifestyle  Work with your doctor to stay at a healthy weight or to lose weight. Ask your doctor what the best weight is for you. Get at least 30 minutes of exercise that causes your heart to beat faster (aerobic exercise) most days of the week. This may include walking, swimming, or biking. Get at least 30 minutes of exercise that strengthens your muscles (resistance exercise) at least 3 days a week. This may include lifting weights or doing Pilates. Do not smoke or use any products that contain nicotine or tobacco. If you need help quitting, ask your doctor. Check your blood pressure at home as told by your doctor. Keep all follow-up visits. Medicines Take over-the-counter and prescription medicines  only as told by your doctor. Follow directions carefully. Do not skip doses of blood pressure medicine. The medicine does not work as well if you skip doses. Skipping doses also puts you at risk for problems. Ask your doctor about side effects or reactions to medicines that you should watch  for. Contact a doctor if: You think you are having a reaction to the medicine you are taking. You have headaches that keep coming back. You feel dizzy. You have swelling in your ankles. You have trouble with your vision. Get help right away if: You get a very bad headache. You start to feel mixed up (confused). You feel weak or numb. You feel faint. You have very bad pain in your: Chest. Belly (abdomen). You vomit more than once. You have trouble breathing. These symptoms may be an emergency. Get help right away. Call 911. Do not wait to see if the symptoms will go away. Do not drive yourself to the hospital. Summary Hypertension is another name for high blood pressure. High blood pressure forces your heart to work harder to pump blood. For most people, a normal blood pressure is less than 120/80. Making healthy choices can help lower blood pressure. If your blood pressure does not get lower with healthy choices, you may need to take medicine. This information is not intended to replace advice given to you by your health care provider. Make sure you discuss any questions you have with your health care provider. Document Revised: 08/12/2021 Document Reviewed: 08/12/2021 Elsevier Patient Education  2024 [employer].

## 2024-08-06 LAB — CBC
Hematocrit: 40.4 % (ref 34.0–46.6)
Hemoglobin: 12 g/dL (ref 11.1–15.9)
MCH: 23.1 pg — ABNORMAL LOW (ref 26.6–33.0)
MCHC: 29.7 g/dL — ABNORMAL LOW (ref 31.5–35.7)
MCV: 78 fL — ABNORMAL LOW (ref 79–97)
Platelets: 328 x10E3/uL (ref 150–450)
RBC: 5.2 x10E6/uL (ref 3.77–5.28)
RDW: 14.5 % (ref 11.7–15.4)
WBC: 5.2 x10E3/uL (ref 3.4–10.8)

## 2024-08-06 LAB — CMP14+EGFR
ALT: 17 IU/L (ref 0–32)
AST: 22 IU/L (ref 0–40)
Albumin: 4.6 g/dL (ref 3.8–4.8)
Alkaline Phosphatase: 79 IU/L (ref 49–135)
BUN/Creatinine Ratio: 19 (ref 12–28)
BUN: 16 mg/dL (ref 8–27)
Bilirubin Total: 0.7 mg/dL (ref 0.0–1.2)
CO2: 23 mmol/L (ref 20–29)
Calcium: 10.1 mg/dL (ref 8.7–10.3)
Chloride: 102 mmol/L (ref 96–106)
Creatinine, Ser: 0.83 mg/dL (ref 0.57–1.00)
Globulin, Total: 2.9 g/dL (ref 1.5–4.5)
Glucose: 105 mg/dL — ABNORMAL HIGH (ref 70–99)
Potassium: 4.8 mmol/L (ref 3.5–5.2)
Sodium: 140 mmol/L (ref 134–144)
Total Protein: 7.5 g/dL (ref 6.0–8.5)
eGFR: 73 mL/min/1.73 (ref >59)

## 2024-08-06 LAB — LIPID PANEL
Chol/HDL Ratio: 2.3 ratio (ref 0.0–4.4)
Cholesterol, Total: 160 mg/dL (ref 100–199)
HDL: 69 mg/dL (ref >39)
LDL Chol Calc (NIH): 73 mg/dL (ref 0–99)
Triglycerides: 97 mg/dL (ref 0–149)
VLDL Cholesterol Cal: 18 mg/dL (ref 5–40)

## 2024-08-06 LAB — HEMOGLOBIN A1C
Est. average glucose Bld gHb Est-mCnc: 134 mg/dL
Hgb A1c MFr Bld: 6.3 % — ABNORMAL HIGH (ref 4.8–5.6)

## 2024-08-15 ENCOUNTER — Ambulatory Visit: Payer: Self-pay | Admitting: Nurse Practitioner

## 2024-08-15 NOTE — Assessment & Plan Note (Signed)
 Chronic, stable.  Continue current medications.

## 2024-08-15 NOTE — Assessment & Plan Note (Signed)
 Blood pressure is well controlled, continue current medications.

## 2024-08-15 NOTE — Assessment & Plan Note (Signed)
 Continue statin, tolerating well

## 2024-08-15 NOTE — Assessment & Plan Note (Signed)
 She is encouraged to strive for BMI less than 30 to decrease cardiac risk. Advised to aim for at least 150 minutes of exercise per week.

## 2024-08-15 NOTE — Assessment & Plan Note (Signed)
 Will check vitamin D  level and supplement as needed.    Also encouraged to spend 15 minutes in the sun daily.

## 2024-08-15 NOTE — Assessment & Plan Note (Signed)
 Blood pressure increased but improved with repeat. Continue current medications

## 2024-08-24 ENCOUNTER — Other Ambulatory Visit: Payer: Self-pay | Admitting: Nurse Practitioner

## 2024-08-24 DIAGNOSIS — I700 Atherosclerosis of aorta: Secondary | ICD-10-CM

## 2024-08-24 DIAGNOSIS — I129 Hypertensive chronic kidney disease with stage 1 through stage 4 chronic kidney disease, or unspecified chronic kidney disease: Secondary | ICD-10-CM

## 2024-10-24 LAB — HM MAMMOGRAPHY

## 2024-10-29 ENCOUNTER — Encounter: Payer: Self-pay | Admitting: Internal Medicine

## 2024-11-20 LAB — OPHTHALMOLOGY REPORT-SCANNED

## 2024-12-04 ENCOUNTER — Ambulatory Visit: Payer: Medicare Other

## 2024-12-04 ENCOUNTER — Ambulatory Visit (INDEPENDENT_AMBULATORY_CARE_PROVIDER_SITE_OTHER)

## 2024-12-04 DIAGNOSIS — Z0000 Encounter for general adult medical examination without abnormal findings: Secondary | ICD-10-CM | POA: Diagnosis not present

## 2024-12-04 NOTE — Progress Notes (Addendum)
 "  Chief Complaint  Patient presents with   Medicare Wellness     Subjective:   Anita Carpenter is a 79 y.o. female who presents for a Medicare Annual Wellness Visit.  Visit info / Clinical Intake: Medicare Wellness Visit Type:: Subsequent Annual Wellness Visit Persons participating in visit and providing information:: patient Medicare Wellness Visit Mode:: Telephone If telephone:: video declined Since this visit was completed virtually, some vitals may be partially provided or unavailable. Missing vitals are due to the limitations of the virtual format.: Unable to obtain vitals - no equipment If Telephone or Video please confirm:: I connected with patient using audio/video enable telemedicine. I verified patient identity with two identifiers, discussed telehealth limitations, and patient agreed to proceed. Patient Location:: home Provider Location:: office Interpreter Needed?: No Pre-visit prep was completed: yes AWV questionnaire completed by patient prior to visit?: yes Date:: 12/03/24 Living arrangements:: (!) (Patient-Rptd) lives alone Patient's Overall Health Status Rating: (Patient-Rptd) excellent Typical amount of pain: (Patient-Rptd) none Does pain affect daily life?: (Patient-Rptd) no Are you currently prescribed opioids?: no  Dietary Habits and Nutritional Risks How many meals a day?: (Patient-Rptd) 2 Eats fruit and vegetables daily?: (Patient-Rptd) yes Most meals are obtained by: (Patient-Rptd) preparing own meals In the last 2 weeks, have you had any of the following?: none Diabetic:: (!) yes Any non-healing wounds?: no How often do you check your BS?: -- (once weekly) Would you like to be referred to a Nutritionist or for Diabetic Management? : no  Functional Status Activities of Daily Living (to include ambulation/medication): (Patient-Rptd) Independent Ambulation: (Patient-Rptd) Independent Medication Administration: (Patient-Rptd) Independent Home Management  (perform basic housework or laundry): (Patient-Rptd) Independent Manage your own finances?: (Patient-Rptd) yes Primary transportation is: (Patient-Rptd) family / friends Concerns about vision?: no *vision screening is required for WTM* Concerns about hearing?: no  Fall Screening Falls in the past year?: (Patient-Rptd) 0 Number of falls in past year: 0 Was there an injury with Fall?: 0 Fall Risk Category Calculator: 0 Patient Fall Risk Level: Low Fall Risk  Fall Risk Patient at Risk for Falls Due to: Medication side effect Fall risk Follow up: Falls prevention discussed; Education provided; Falls evaluation completed  Home and Transportation Safety: All rugs have non-skid backing?: (Patient-Rptd) yes All stairs or steps have railings?: (Patient-Rptd) N/A, no stairs Grab bars in the bathtub or shower?: (!) (Patient-Rptd) no Have non-skid surface in bathtub or shower?: (Patient-Rptd) yes Good home lighting?: (Patient-Rptd) yes Regular seat belt use?: (Patient-Rptd) yes Hospital stays in the last year:: (Patient-Rptd) no  Cognitive Assessment Difficulty concentrating, remembering, or making decisions? : (Patient-Rptd) no Will 6CIT or Mini Cog be Completed: yes What year is it?: 0 points What month is it?: 0 points Give patient an address phrase to remember (5 components): [address] [place] About what time is it?: 0 points Count backwards from 20 to 1: 0 points Say the months of the year in reverse: 0 points Repeat the address phrase from earlier: 0 points 6 CIT Score: 0 points  Advance Directives (For Healthcare) Does Patient Have a Medical Advance Directive?: No Would patient like information on creating a medical advance directive?: No - Patient declined  Reviewed/Updated  Reviewed/Updated: Reviewed All (Medical, Surgical, Family, Medications, Allergies, Care Teams, Patient Goals)    Allergies (verified) Doxycycline and Olmesartan medoxomil-hctz   Current  Medications (verified) Outpatient Encounter Medications as of 12/04/2024  Medication Sig   amLODipine  (NORVASC ) 5 MG tablet TAKE 1 TABLET (5 MG TOTAL) BY MOUTH  DAILY.   Cholecalciferol (VITAMIN D -3) 125 MCG (5000 UT) TABS Take 1 tablet by mouth 2 (two) times a week.   ferrous sulfate  325 (65 FE) MG tablet Take 1 tablet (325 mg total) by mouth daily with breakfast.   glucose blood test strip 1 each by Other route daily. Use as instructed   hydrALAZINE  (APRESOLINE ) 25 MG tablet TAKE 1 TABLET (25 MG TOTAL) BY MOUTH DAILY.   metoprolol  tartrate (LOPRESSOR ) 25 MG tablet TAKE 1 TABLET (25 MG TOTAL) BY MOUTH DAILY.   nystatin  powder Apply 1 Application topically 3 (three) times daily.   rosuvastatin  (CRESTOR ) 10 MG tablet TAKE 1 TABLET BY MOUTH EVERY DAY   No facility-administered encounter medications on file as of 12/04/2024.    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   Diabetes mellitus without complication (HCC)    Dyspnea 05/07/2019   Heart murmur 1969   High cholesterol    History of tobacco use 05/07/2019   Hypertension    Past Surgical History:  Procedure Laterality Date   TONSILLECTOMY     TUBAL LIGATION  1985   Family History  Problem Relation Age of Onset   Asthma Other    Diabetes Other    Hyperlipidemia Other    Hyperlipidemia Mother    Hypertension Mother    Kidney failure Father    Stroke Father    Diabetes Daughter    Social History   Occupational History   Occupation: retired  Tobacco Use   Smoking status: Former    Current packs/day: 0.00    Average packs/day: 0.3 packs/day for 67.0 years (17.6 ttl pk-yrs)    Types: Cigarettes    Start date: 63    Quit date: 12/03/2018    Years since quitting: 6.0   Smokeless tobacco: Never   Tobacco comments:    1ppdx  Vaping Use   Vaping status: Never Used  Substance and Sexual Activity   Alcohol use: Not Currently   Drug  use: Never   Sexual activity: Not Currently    Birth control/protection: Abstinence   Tobacco Counseling Counseling given: Not Answered Tobacco comments: 1ppdx  SDOH Screenings   Food Insecurity: No Food Insecurity (12/03/2024)  Housing: Unknown (12/03/2024)  Transportation Needs: No Transportation Needs (12/03/2024)  Utilities: Not At Risk (12/04/2024)  Alcohol Screen: Low Risk (12/03/2024)  Depression (PHQ2-9): Low Risk (12/04/2024)  Financial Resource Strain: Low Risk (12/03/2024)  Physical Activity: Insufficiently Active (12/03/2024)  Social Connections: Moderately Isolated (12/03/2024)  Stress: No Stress Concern Present (12/03/2024)  Tobacco Use: Medium Risk (12/04/2024)  Health Literacy: Adequate Health Literacy (12/04/2024)   See flowsheets for full screening details  Depression Screen PHQ 2 & 9 Depression Scale- Over the past 2 weeks, how often have you been bothered by any of the following problems? Little interest or pleasure in doing things: 0 Feeling down, depressed, or hopeless (PHQ Adolescent also includes...irritable): 0 PHQ-2 Total Score: 0 Trouble falling or staying asleep, or sleeping too much: 0 Feeling tired or having little energy: 0 Poor appetite or overeating (PHQ Adolescent also includes...weight loss): 0 Feeling bad about yourself - or that you are a failure or have let yourself or your family down: 0 Trouble concentrating on things, such as reading the newspaper or watching television (PHQ Adolescent also includes...like school work): 0 Moving or speaking so slowly that other people could have noticed. Or the opposite - being so  fidgety or restless that you have been moving around a lot more than usual: 0 Thoughts that you would be better off dead, or of hurting yourself in some way: 0 PHQ-9 Total Score: 0 If you checked off any problems, how difficult have these problems made it for you to do your work, take care of things at home, or get along with other people?:  Not difficult at all  Depression Treatment Depression Interventions/Treatment : EYV7-0 Score <4 Follow-up Not Indicated     Goals Addressed               This Visit's Progress     increase exercise (pt-stated)               Objective:    Today's Vitals   There is no height or weight on file to calculate BMI.  Hearing/Vision screen Vision Screening - Comments:: Regular eye exams, [facility] Immunizations and Health Maintenance Health Maintenance  Topic Date Due   Zoster Vaccines- Shingrix  (1 of 2) Never done   DTaP/Tdap/Td (2 - Td or Tdap) 04/03/2025 (Originally 08/06/2023)   COVID-19 Vaccine (8 - Pfizer risk 2025-26 season) 01/26/2025   HEMOGLOBIN A1C  02/02/2025   Diabetic kidney evaluation - Urine ACR  04/03/2025   FOOT EXAM  04/03/2025   Diabetic kidney evaluation - eGFR measurement  08/05/2025   Mammogram  10/24/2025   OPHTHALMOLOGY EXAM  11/20/2025   Medicare Annual Wellness (AWV)  12/04/2025   Pneumococcal Vaccine: 50+ Years  Completed   Influenza Vaccine  Completed   Bone Density Scan  Completed   Hepatitis C Screening  Completed   Meningococcal B Vaccine  Aged Out   Colonoscopy  Discontinued        Assessment/Plan:  This is a routine wellness examination for Anita Carpenter.  Patient Care Team: Georgina Speaks, FNP as PCP - General (General Practice) [facility] Octavia Charlie Hamilton, MD as Consulting Physician (Ophthalmology)  I have personally reviewed and noted the following in the patients chart:   Medical and social history Use of alcohol, tobacco or illicit drugs  Current medications and supplements including opioid prescriptions. Functional ability and status Nutritional status Physical activity Advanced directives List of other physicians Hospitalizations, surgeries, and ER visits in previous 12 months Vitals Screenings to include cognitive, depression, and falls Referrals and appointments  No orders of the defined  types were placed in this encounter.  In addition, I have reviewed and discussed with patient certain preventive protocols, quality metrics, and best practice recommendations. A written personalized care plan for preventive services as well as general preventive health recommendations were provided to patient.   Anita Carpenter Dawn, LPN   [id number]/71/7973   Return in 1 year (on 12/04/2025).  After Visit Summary: (MyChart) Due to this being a telephonic visit, the after visit summary with patients personalized plan was offered to patient via MyChart   Nurse Notes: HM Addressed: Vaccines Due: shingles  "

## 2024-12-04 NOTE — Patient Instructions (Signed)
 Anita Carpenter,  Thank you for taking the time for your Medicare Wellness Visit. I appreciate your continued commitment to your health goals. Please review the care plan we discussed, and feel free to reach out if I can assist you further.  Please note that Annual Wellness Visits do not include a physical exam. Some assessments may be limited, especially if the visit was conducted virtually. If needed, we may recommend an in-person follow-up with your provider.  Ongoing Care Seeing your primary care provider every 3 to 6 months helps us  monitor your health and provide consistent, personalized care.   Referrals If a referral was made during today's visit and you haven't received any updates within two weeks, please contact the referred provider directly to check on the status.  Recommended Screenings:  Health Maintenance  Topic Date Due   Zoster (Shingles) Vaccine (1 of 2) Never done   Medicare Annual Wellness Visit  11/21/2024   DTaP/Tdap/Td vaccine (2 - Td or Tdap) 04/03/2025*   COVID-19 Vaccine (8 - Pfizer risk 2025-26 season) 01/26/2025   Hemoglobin A1C  02/02/2025   Kidney health urinalysis for diabetes  04/03/2025   Complete foot exam   04/03/2025   Yearly kidney function blood test for diabetes  08/05/2025   Breast Cancer Screening  10/24/2025   Eye exam for diabetics  11/20/2025   Pneumococcal Vaccine for age over 88  Completed   Flu Shot  Completed   Osteoporosis screening with Bone Density Scan  Completed   Hepatitis C Screening  Completed   Meningitis B Vaccine  Aged Out   Colon Cancer Screening  Discontinued  *Topic was postponed. The date shown is not the original due date.       12/03/2024    8:04 PM  Advanced Directives  Does Patient Have a Medical Advance Directive? No  Would patient like information on creating a medical advance directive? No - Patient declined    Vision: Annual vision screenings are recommended for early detection of glaucoma, cataracts, and  diabetic retinopathy. These exams can also reveal signs of chronic conditions such as diabetes and high blood pressure.  Dental: Annual dental screenings help detect early signs of oral cancer, gum disease, and other conditions linked to overall health, including heart disease and diabetes.  Please see the attached documents for additional preventive care recommendations.

## 2024-12-09 ENCOUNTER — Telehealth: Payer: Self-pay

## 2024-12-09 ENCOUNTER — Telehealth: Payer: Self-pay | Admitting: Nurse Practitioner

## 2024-12-09 DIAGNOSIS — I129 Hypertensive chronic kidney disease with stage 1 through stage 4 chronic kidney disease, or unspecified chronic kidney disease: Secondary | ICD-10-CM

## 2024-12-09 DIAGNOSIS — N182 Chronic kidney disease, stage 2 (mild): Secondary | ICD-10-CM

## 2024-12-09 DIAGNOSIS — E1122 Type 2 diabetes mellitus with diabetic chronic kidney disease: Secondary | ICD-10-CM

## 2024-12-09 NOTE — Telephone Encounter (Signed)
 Attempted to call patient x2 on mobile and home number in chart no answer - lvm

## 2024-12-11 NOTE — Progress Notes (Signed)
 Not seen

## 2025-04-07 ENCOUNTER — Encounter: Payer: Self-pay | Admitting: Nurse Practitioner

## 2025-12-17 ENCOUNTER — Ambulatory Visit: Payer: Self-pay
